# Patient Record
Sex: Male | Born: 2000 | Race: Black or African American | Hispanic: No | Marital: Single | Smoking: Current some day smoker
Health system: Southern US, Community
[De-identification: ages and names within clinical notes are randomized; demographics above are authoritative.]

## PROBLEM LIST (undated history)

## (undated) DIAGNOSIS — J302 Other seasonal allergic rhinitis: Secondary | ICD-10-CM

## (undated) HISTORY — PX: APPENDECTOMY: SHX54

---
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
## 2020-01-26 ENCOUNTER — Emergency Department (HOSPITAL_COMMUNITY)
Admission: EM | Admit: 2020-01-26 | Discharge: 2020-01-27 | Disposition: A | Discharge status: Home / Self Care | Payer: Medicaid Other | Attending: Emergency Medicine | Admitting: Emergency Medicine

## 2020-01-26 ENCOUNTER — Emergency Department (HOSPITAL_COMMUNITY): Payer: Medicaid Other

## 2020-01-26 ENCOUNTER — Encounter (HOSPITAL_COMMUNITY): Payer: Self-pay | Admitting: *Deleted

## 2020-01-26 ENCOUNTER — Other Ambulatory Visit: Payer: Self-pay

## 2020-01-26 DIAGNOSIS — J45909 Unspecified asthma, uncomplicated: Secondary | ICD-10-CM

## 2020-01-26 DIAGNOSIS — S161XXA Strain of muscle, fascia and tendon at neck level, initial encounter: Secondary | ICD-10-CM

## 2020-01-26 DIAGNOSIS — F17200 Nicotine dependence, unspecified, uncomplicated: Secondary | ICD-10-CM | POA: Insufficient documentation

## 2020-01-26 DIAGNOSIS — R0789 Other chest pain: Secondary | ICD-10-CM | POA: Diagnosis not present

## 2020-01-26 DIAGNOSIS — M542 Cervicalgia: Secondary | ICD-10-CM | POA: Insufficient documentation

## 2020-01-26 LAB — I-STAT CREATININE, ED: Creatinine, Ser: 0.9 mg/dL (ref 0.61–1.24)

## 2020-01-26 LAB — D-DIMER, QUANTITATIVE: D-Dimer, Quant: 0.89 ug/mL-FEU — ABNORMAL HIGH (ref 0.00–0.50)

## 2020-01-26 MED ORDER — IBUPROFEN 800 MG PO TABS: 800.0000 mg | ORAL_TABLET | Freq: Three times a day (TID) | ORAL | 0 refills | Status: DC

## 2020-01-26 MED ORDER — METHOCARBAMOL 500 MG PO TABS: 500.0000 mg | ORAL_TABLET | Freq: Two times a day (BID) | ORAL | 0 refills | Status: DC

## 2020-01-26 NOTE — ED Triage Notes (Signed)
The pt is c/o lt soided neck pain for 2 years and sometimes it radiated around into his ribs  No acute distress

## 2020-01-26 NOTE — ED Provider Notes (Signed)
[facility] EMERGENCY DEPARTMENT Provider Note   CSN: [id number] Arrival date & time: 01/26/20  2016     History Chief Complaint  Patient presents with  . Neck Pain    Scott Wiggins is a 19 y.o. male.  HPI 19 year old male with no significant medical history presents to the ER for left-sided neck pain which has been intermittent over the last 2 years.  He states that he feels pain on the left lateral side of his neck whenever he breathes in.  States that this started this morning.  He states he has this happen to him every now and then, over the last 2 years.  He describes it as sharp, "as if someone is poking the me from the inside".  States that sometimes this kind of pain is in his right lower rib cage.  Other times it can be on the left lateral side of his neck.  He denies any chest pain, shortness of breath, fevers, chills, neck pain, back pain, numbness, tingling, weakness down his arms or legs.  He has no difficulty swallowing.  No neck stiffness.[name]  He is unsure why this happens, states that he just has not seen a doctor to follow-up on this.  He denies any heavy lifting, injuries, no inciting incident.  He has not taken anything for symptoms.    History reviewed. No pertinent past medical history.  There are no problems to display for this patient.   History reviewed. No pertinent surgical history.     No family history on file.  Social History   Tobacco Use  . Smoking status: Current Every Day Smoker  . Smokeless tobacco: Never Used  Substance Use Topics  . Alcohol use: Never  . Drug use: Not on file    Home Medications Prior to Admission medications   Medication Sig Start Date End Date Taking? Authorizing Provider  ibuprofen (ADVIL) 800 MG tablet Take 1 tablet (800 mg total) by mouth 3 (three) times daily. 01/26/20   Garald Balding, PA-C  methocarbamol (ROBAXIN) 500 MG tablet Take 1 tablet (500 mg total) by mouth 2 (two) times daily.  01/26/20   Garald Balding, PA-C    Allergies    Patient has no known allergies.  Review of Systems   Review of Systems  Constitutional: Negative for chills and fever.  Respiratory: Negative for chest tightness and shortness of breath.   Cardiovascular: Negative for chest pain.  Gastrointestinal: Negative for abdominal pain.  Musculoskeletal: Positive for neck pain. Negative for back pain and neck stiffness.  Skin: Negative for rash and wound.  Neurological: Negative for tremors, syncope, weakness, numbness and headaches.  Psychiatric/Behavioral: Negative for confusion.    Physical Exam Updated Vital Signs BP 128/68 (BP Location: Left Arm)   Pulse 63   Temp 98.6 F (37 C) (Oral)   Resp 14   Ht 5\' 8"  (1.727 m)   Wt 95.3 kg   SpO2 100%   BMI 31.95 kg/m   Physical Exam Vitals and nursing note reviewed.  Constitutional:      General: He is not in acute distress.    Appearance: Normal appearance. He is well-developed. He is not ill-appearing, toxic-appearing or diaphoretic.  HENT:     Head: Normocephalic and atraumatic.     Nose: Nose normal.     Mouth/Throat:     Mouth: Mucous membranes are moist.  Eyes:     Extraocular Movements: Extraocular movements intact.     Conjunctiva/sclera: Conjunctivae normal.  Pupils: Pupils are equal, round, and reactive to light.  Neck:     Vascular: No carotid bruit.     Comments: No overlying erythema, masses, superior navicular lymphadenopathy.  Full range of motion of neck, 5/5 strength.  No tenderness to palpation over neck bilaterally.  No mastoid tenderness. Cardiovascular:     Rate and Rhythm: Normal rate and regular rhythm.     Pulses: Normal pulses.     Heart sounds: Normal heart sounds. No murmur.  Pulmonary:     Effort: Pulmonary effort is normal. No respiratory distress.     Breath sounds: Normal breath sounds.  Abdominal:     General: Abdomen is flat.     Palpations: Abdomen is soft.     Tenderness: There is no  abdominal tenderness.  Musculoskeletal:        General: No swelling, tenderness, deformity or signs of injury.     Cervical back: Neck supple. No rigidity or tenderness.     Comments: No chest wall tenderness, 5/5 strength in upper and lower extremities.  Sensations intact.  Moving all 4 extremities without difficulty.  Lymphadenopathy:     Cervical: No cervical adenopathy.  Skin:    General: Skin is warm and dry.     Coloration: Skin is not pale.     Findings: No bruising, lesion or rash.  Neurological:     General: No focal deficit present.     Mental Status: He is alert and oriented to person, place, and time.     Sensory: No sensory deficit.     Motor: No weakness.  Psychiatric:        Mood and Affect: Mood normal.        Behavior: Behavior normal.     ED Results / Procedures / Treatments   Labs (all labs ordered are listed, but only abnormal results are displayed) Labs Reviewed  D-DIMER, QUANTITATIVE (NOT AT [facility])    EKG None  Radiology DG Chest Portable 1 View  Result Date: 01/26/2020 CLINICAL DATA:  19 year old male with right chest pain. EXAM: PORTABLE CHEST 1 VIEW COMPARISON:  None. FINDINGS: The lungs are clear. There is no pleural effusion pneumothorax. The cardiac silhouette is within normal limits. No acute osseous pathology. No displaced rib fractures. Soft tissue contusion over the right lateral chest wall. IMPRESSION: No acute cardiopulmonary process. Electronically Signed   By: Elgie Collard M.D.   On: 01/26/2020 22:05    Procedures Procedures (including critical care time)  Medications Ordered in ED Medications - No data to display  ED Course  I have reviewed the triage vital signs and the nursing notes.  Pertinent labs & imaging results that were available during my care of the patient were reviewed by me and considered in my medical decision making (see chart for details).    MDM Rules/Calculators/A&P                     19 year old male with  no medical history with intermittent lateral neck pain and occasional rib pain over the last 2 years. On presentation to the ER, the patient is alert and oriented, nontoxic-appearing, no acute distress, with no increased work of breathing.  Vitals overall reassuring.  Sickle exam without tenderness to palpation, no noticeable masses, normal range of motion, sensations, strength upper extremities.  No evidence of neck stiffness.  He is not tender to palpation to the area where he states he feels pain with every breath.  Rib cage and chest  wall without tenderness, erythema, evidence of injury.  He denies any injuries, falls, heavy lifting.  Given pain with inspiration, will order chest x-ray and D-dimer, though my suspicion for PE/pneumothorax is low.   If this is negative, will DC with muscle relaxers and anti-inflammatories, with close follow-up with PCP.Considered dissection though my suspicion is low given these symptoms have been going on on and off for 2 years.   D-dimer elevated.  After discussion with the patient and his mother, will order CT PE.  Signed out patient to [facility], PA-C.  Pending normal CT scan, anticipate the patient be stable for discharge. D/c w/ muscle relaxer and anti-inflammatories with close PCP followup  Final Clinical Impression(s) / ED Diagnoses Final diagnoses:  Neck pain    Rx / DC Orders ED Discharge Orders         Ordered    ibuprofen (ADVIL) 800 MG tablet  3 times daily     01/26/20 2209    methocarbamol (ROBAXIN) 500 MG tablet  2 times daily     01/26/20 2209           Leone Brand 01/27/20 Amaryllis Dyke, MD 01/27/20 1505

## 2020-01-27 ENCOUNTER — Emergency Department (HOSPITAL_COMMUNITY): Payer: Medicaid Other

## 2020-01-27 MED ORDER — ALBUTEROL SULFATE HFA 108 (90 BASE) MCG/ACT IN AERS
2.0000 | INHALATION_SPRAY | RESPIRATORY_TRACT | 0 refills | Status: DC | PRN
Start: 2020-01-27 — End: 2020-10-03

## 2020-01-27 MED ORDER — IOHEXOL 350 MG/ML SOLN
100.0000 mL | Freq: Once | INTRAVENOUS | Status: AC | PRN
  Administered 2020-01-27: 100 mL via INTRAVENOUS

## 2020-01-27 MED ORDER — IBUPROFEN 600 MG PO TABS: 600.0000 mg | ORAL_TABLET | Freq: Four times a day (QID) | ORAL | 0 refills | Status: DC | PRN

## 2020-01-27 MED ORDER — KETOROLAC TROMETHAMINE 30 MG/ML IJ SOLN
30.0000 mg | Freq: Once | INTRAMUSCULAR | Status: AC
  Administered 2020-01-27: 30 mg via INTRAVENOUS
  Filled 2020-01-27: qty 1

## 2020-01-27 NOTE — Discharge Instructions (Addendum)
Your work-up overall today was reassuring.  Your CT scan did not reveal a specific cause of pain.  However, it did not show a blood clot.  Take 650 mg of Tylenol or 600 mg of ibuprofen with food every 6 hours for pain.  You can alternate between these 2 medications every 3 hours if your pain returns.  For instance, you can take Tylenol at noon, followed by a dose of ibuprofen at 3, followed by second dose of Tylenol and 6.  If the pain in your ribs starts again or if you develop shortness of breath, you can use 2 puffs of the albuterol inhaler once every 4 hours.  Call the number on your discharge paperwork to get established with a primary care provider if your symptoms do not significantly improve with the regimen listed above.  Return to the emergency department if you develop trouble breathing, if your fingers or lips turn blue, if you pass out, develop uncontrollable vomiting, or other new, concerning symptoms.

## 2020-01-27 NOTE — ED Notes (Signed)
CT paged to, states 15-61min until pt can be scanned.

## 2020-01-27 NOTE — ED Provider Notes (Signed)
19 year old male received at signout from [place] pending CT PE study. Per her HPI:   "Scott Wiggins is a 19 y.o. male.  HPI 19 year old male with no significant medical history presents to the ER for left-sided neck pain which has been intermittent over the last 2 years.  He states that he feels pain on the left lateral side of his neck whenever he breathes in.  States that this started this morning.  He states he has this happen to him every now and then, over the last 2 years.  He describes it as sharp, "as if someone is poking the me from the inside".  States that sometimes this kind of pain is in his right lower rib cage.  Other times it can be on the left lateral side of his neck.  He denies any chest pain, shortness of breath, fevers, chills, neck pain, back pain, numbness, tingling, weakness down his arms or legs.  He has no difficulty swallowing.  No neck stiffness.[name]  He is unsure why this happens, states that he just has not seen a doctor to follow-up on this.  He denies any heavy lifting, injuries, no inciting incident.  He has not taken anything for symptoms.  History reviewed. No pertinent past medical history.  There are no problems to display for this patient.   History reviewed. No pertinent surgical history."     Physical Exam  BP 128/68 (BP Location: Left Arm)   Pulse 63   Temp 98.6 F (37 C) (Oral)   Resp 14   Ht 5\' 8"  (1.727 m)   Wt 95.3 kg   SpO2 100%   BMI 31.95 kg/m   Physical Exam Constitutional:      Appearance: He is well-developed.  HENT:     Head: Normocephalic and atraumatic.  Neck:     Comments: Full active and passive range of motion of the neck.  No meningismus. Cardiovascular:     Pulses: Normal pulses.     Heart sounds: Normal heart sounds. No murmur. No friction rub. No gallop.   Pulmonary:     Effort: Pulmonary effort is normal. No respiratory distress.     Breath sounds: No stridor. No wheezing, rhonchi or rales.  Chest:      Chest wall: No tenderness.  Musculoskeletal:     Cervical back: Normal range of motion and neck supple.  Neurological:     Mental Status: He is alert and oriented to person, place, and time.     Cranial Nerves: No cranial nerve deficit.  Psychiatric:        Behavior: Behavior normal.     ED Course/Procedures     Procedures  MDM   19 year old male received a signout from [address] a pending CT PE study.  Please see her note for further work-up and medical decision making.  Study is negative for PE.  There is some concern for mild air trapping.  On my evaluation, the patient does endorse that he smokes Black and mild and occasionally smokes marijuana.  We will give the patient a prescription for albuterol inhaler.  His lungs are clear to auscultation bilaterally and are not diminished on my evaluation.  He has no increased work of breathing.  Regarding his neck pain, suspect musculoskeletal etiology given the duration and intermittent nature.  I have a very, very low suspicion for aortic dissection. Toradol given in the ER.  He was advised to follow-up with primary care if symptoms persist.  He is  hemodynamically stable no acute distress.  Safe for discharge home with outpatient follow-up as indicated.       Joanne Gavel, PA-C 01/27/20 0940    Fatima Blank, MD 01/28/20 [phone]

## 2020-01-27 NOTE — ED Notes (Signed)
Patient verbalizes understanding of discharge instructions. Opportunity for questioning and answers were provided. Armband removed by staff, pt discharged from ED. Pt. ambulatory and discharged home.  

## 2020-02-22 ENCOUNTER — Emergency Department (HOSPITAL_COMMUNITY)
Admission: EM | Admit: 2020-02-22 | Discharge: 2020-02-22 | Disposition: A | Discharge status: Home / Self Care | Payer: Medicaid Other | Attending: Emergency Medicine | Admitting: Emergency Medicine

## 2020-02-22 ENCOUNTER — Encounter (HOSPITAL_COMMUNITY): Payer: Self-pay | Admitting: Emergency Medicine

## 2020-02-22 ENCOUNTER — Other Ambulatory Visit: Payer: Self-pay

## 2020-02-22 DIAGNOSIS — L0501 Pilonidal cyst with abscess: Secondary | ICD-10-CM

## 2020-02-22 DIAGNOSIS — Z79899 Other long term (current) drug therapy: Secondary | ICD-10-CM | POA: Insufficient documentation

## 2020-02-22 DIAGNOSIS — F17200 Nicotine dependence, unspecified, uncomplicated: Secondary | ICD-10-CM | POA: Diagnosis not present

## 2020-02-22 DIAGNOSIS — J45909 Unspecified asthma, uncomplicated: Secondary | ICD-10-CM | POA: Insufficient documentation

## 2020-02-22 MED ORDER — DOXYCYCLINE HYCLATE 100 MG PO CAPS: 100.0000 mg | ORAL_CAPSULE | Freq: Two times a day (BID) | ORAL | 0 refills | Status: AC

## 2020-02-22 MED ORDER — LIDOCAINE-EPINEPHRINE (PF) 2 %-1:200000 IJ SOLN
10.0000 mL | Freq: Once | INTRAMUSCULAR | Status: AC
  Administered 2020-02-22: 10 mL
  Filled 2020-02-22: qty 20

## 2020-02-22 NOTE — ED Triage Notes (Signed)
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

## 2020-02-22 NOTE — Discharge Instructions (Signed)
Take antibiotics as prescribed.  Take the entire course, even if your symptoms improve. Use Tylenol or ibuprofen as needed for pain. Use warm compresses to help with pain and swelling. Follow-up with general surgery for further evaluation and management of your pilonidal cyst. Return to the emergency room if you develop fevers, worsening pain, increased pus draining from the area, or any new, worsening, concerning symptoms.

## 2020-02-22 NOTE — ED Provider Notes (Signed)
[facility] DEPT Provider Note   CSN: [id number] Arrival date & time: 02/22/20  1049     History Chief Complaint  Patient presents with  . Abscess    Scott Wiggins is a 19 y.o. male presenting for evaluation of boil of the buttocks.  Patient states the past 4 days, he has had increasing pain and swelling in the pilonidal region with gluteal cleft.  He reports a history of similar, has had approximately 5 I&D's in the past.  He denies fevers, chills, or pain when doing bowel movement.  He denies pain by his rectum.  Denies nausea, vomiting, abdominal pain.  He has a history of asthma, no other medical problems.  He has not followed up with general surgery regarding this.  He has been taking warm showers and using Tylenol without improvement of pain control.  It has not been draining.  HPI     History reviewed. No pertinent past medical history.  There are no problems to display for this patient.   History reviewed. No pertinent surgical history.     No family history on file.  Social History   Tobacco Use  . Smoking status: Current Every Day Smoker  . Smokeless tobacco: Never Used  Substance Use Topics  . Alcohol use: Never  . Drug use: Yes    Types: Marijuana    Home Medications Prior to Admission medications   Medication Sig Start Date End Date Taking? Authorizing Provider  albuterol (VENTOLIN HFA) 108 (90 Base) MCG/ACT inhaler Inhale 2 puffs into the lungs every 4 (four) hours as needed for wheezing or shortness of breath. 01/27/20   McDonald, Mia A, PA-C  doxycycline (VIBRAMYCIN) 100 MG capsule Take 1 capsule (100 mg total) by mouth 2 (two) times daily for 7 days. 02/22/20 02/29/20  Onedia Vargus, PA-C  ibuprofen (ADVIL) 600 MG tablet Take 1 tablet (600 mg total) by mouth every 6 (six) hours as needed. 01/27/20   McDonald, Mia A, PA-C    Allergies    Patient has no known allergies.  Review of Systems   Review of Systems    Constitutional: Negative for fever.  Skin:       Pilonidal abcess    Physical Exam Updated Vital Signs BP 118/76 (BP Location: Right Arm)   Pulse 98   Temp 98 F (36.7 C) (Oral)   Resp 19   SpO2 99%   Physical Exam Vitals and nursing note reviewed. Exam conducted with a chaperone present.  Constitutional:      General: He is not in acute distress.    Appearance: He is well-developed.  HENT:     Head: Normocephalic and atraumatic.  Pulmonary:     Effort: Pulmonary effort is normal.  Abdominal:     General: There is no distension.     Palpations: There is no mass.     Tenderness: There is no abdominal tenderness. There is no guarding or rebound.  Genitourinary:    Comments: Fluctuance and pain at the apex of the gluteal cleft consistent with pilonidal abscess.  No surrounding skin erythema, warmth, or induration. No pain near the rectum Musculoskeletal:        General: Normal range of motion.     Cervical back: Normal range of motion.  Skin:    General: Skin is warm.     Findings: No rash.  Neurological:     Mental Status: He is alert and oriented to person, place, and time.  ED Results / Procedures / Treatments   Labs (all labs ordered are listed, but only abnormal results are displayed) Labs Reviewed - No data to display  EKG None  Radiology No results found.  Procedures .Marland KitchenIncision and Drainage  Date/Time: 02/22/2020 1:14 PM Performed by: Alveria Apley, PA-C Authorized by: Alveria Apley, PA-C   Consent:    Consent obtained:  Verbal   Consent given by:  Patient   Risks discussed:  Bleeding, damage to other organs, infection, incomplete drainage and pain Location:    Type:  Pilonidal cyst   Location:  Anogenital   Anogenital location:  Pilonidal Pre-procedure details:    Skin preparation:  Antiseptic wash Anesthesia (see MAR for exact dosages):    Anesthesia method:  Local infiltration   Local anesthetic:  Lidocaine 2% WITH  epi Procedure type:    Complexity:  Simple Procedure details:    Needle aspiration: no     Incision types:  Single straight   Incision depth:  Dermal   Scalpel blade:  11   Wound management:  Probed and deloculated   Drainage:  Purulent   Drainage amount:  Copious   Packing materials:  None Post-procedure details:    Patient tolerance of procedure:  Tolerated well, no immediate complications   (including critical care time)  Medications Ordered in ED Medications  lidocaine-EPINEPHrine (XYLOCAINE W/EPI) 2 %-1:200000 (PF) injection 10 mL (10 mLs Infiltration Given 02/22/20 1210)    ED Course  I have reviewed the triage vital signs and the nursing notes.  Pertinent labs & imaging results that were available during my care of the patient were reviewed by me and considered in my medical decision making (see chart for details).    MDM Rules/Calculators/A&P                          Patient presenting with pilonidal abscess.  No signs of systemic infection.  No pain with rectum or pain with bowel movements, do not believe he needs CT.  I&D performed as described above, copious purulent material expressed.  As such, will place on antibiotics.  Encouraged follow-up with general surgery due to recurrent abscesses.  At this time, patient appears safe for discharge.  Return precautions given.  Patient states he understands and agrees to plan.  Final Clinical Impression(s) / ED Diagnoses Final diagnoses:  Pilonidal abscess    Rx / DC Orders ED Discharge Orders         Ordered    doxycycline (VIBRAMYCIN) 100 MG capsule  2 times daily     Discontinue  Reprint     02/22/20 [address] Hydia Copelin, PA-C 02/22/20 1315    Alvira Monday, MD 02/23/20 1512

## 2020-04-04 ENCOUNTER — Ambulatory Visit: Payer: Self-pay | Admitting: Surgery

## 2020-04-04 NOTE — H&P (Signed)
Scott Wiggins Appointment: 04/04/2020 11:00 AM Location: [facility] Patient #: [id number] DOB: 07/23/2001 Single / Language: English / Race: Black or African American Male  History of Present Illness (Philisha Weinel A. 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 He was last seen here in June. His disease is quiet now but he is interested in excision given his multiple bouts of it . He denies pain or drainage today. He has no fever or chills today.  The patient is a 19 year old male.   Allergies (Chanel Nolan, CMA; 04/04/2020 11:00 AM) SALMON Allergies Reconciled  Medication History (Chanel Nolan, CMA; 04/04/2020 11:00 AM) Doxycycline Hyclate (100MG Tablet, Oral) Active. Medications Reconciled    Vitals (Chanel Nolan CMA; 04/04/2020 11:01 AM) 04/04/2020 11:01 AM Weight: 198 lb (93rd percentile) Height: 68in (30th percentile) Body Surface Area: 2.04 m Body Mass Index: 30.11 kg/m  (96th percentile)  Temp.: 96.8F  Pulse: 104 (Regular)   Percentiles calculated using CDC data for children 2-20 years.      Physical Exam (Bruin Bolger A. Carollyn Etcheverry MD; 04/04/2020 11:17 AM)  General Mental Status-Alert. General Appearance-Consistent with stated age. Hydration-Well hydrated. Voice-Normal.  Integumentary Note: 3 cm x 6 cm region of pitting in the gluteal cleft. No active disease. Her follow-up is present. No abscess.  Head and Neck Head-normocephalic, atraumatic with no lesions or palpable masses.  Chest and Lung Exam Chest and lung exam reveals -quiet, even and easy respiratory effort with no use of accessory muscles and on auscultation, normal breath sounds, no adventitious sounds and normal vocal resonance. Inspection Chest Wall - Normal. Back - normal.  Cardiovascular Cardiovascular examination  reveals -on palpation PMI is normal in location and amplitude, no palpable S3 or S4. Normal cardiac borders., normal heart sounds, regular rate and rhythm with no murmurs, carotid auscultation reveals no bruits and normal pedal pulses bilaterally.  Neurologic Neurologic evaluation reveals -alert and oriented x 3 with no impairment of recent or remote memory. Mental Status-Normal.  Musculoskeletal Normal Exam - Left-Upper Extremity Strength Normal and Lower Extremity Strength Normal. Normal Exam - Right-Upper Extremity Strength Normal, Lower Extremity Weakness.    Assessment & Plan (Sherry Blackard A. Aimee Heldman MD; 04/04/2020 11:18 AM)  PILONIDAL ABSCESS (L05.01) Impression: Discussed surgery. The pros and cons of excision were discussed. The risk of wound breakdown is 50% and long-term wound healing issues were discussed since it may take up to a year to heal in some patients. I offered nonoperative management as well since his attacks appear to be not severe but are frequent. Certainly he can be treated with I and D as needed. He is opted for surgical excision. I discussed excision as well as complications of possible advancement flap coverage of the area. Risk of bleeding, infection, poor wound healing, recurrence, injury to neighboring structures, deep vein thrombosis, wound infections in the further treatments and procedures discussed with the patient.  Current Plans Pt Education - [facility] Free Text Education/Instructions: discussed with patient and provided information. The anatomy of the intragluteal cleft was discussed. Pathophysiology of pilonidal disease was discussed. The importance of keeping hairs trimmed to avoid recurrence was discussed. Discussion of options such as curretage, excision with closure vs leaving open was discussed. Risks of infection with need for incision and drainage & antibiotics were discussed. I noted a good likelihood this will help address the problem.  At  this point, I   think the patient would best served with considering surgery to excise the diseased tissue. I will make an attempt to close but it may need to be left open to allow it to heal with secondary intention and wound packing. Possible recurrences need reoperation or different techniques were discussed as well. I noted that recurrence is higher with poor compliance on hair removal hygiene & overall health. The patient's questions were answered. The patient agrees to proceed.  Pt Education - [facility] Pilonidal Surgery HCI - post op instructions: discussed with patient and provided information. The anatomy of the gluteal and sacral region was discussed. Pathophysiology of pilonidal disease due to ingrown hairs was discussed. Importance of good hygiene discussed. Importance of keeping hairs trimmed in the intergluteal crease from anus to top of sacrum/tailbone spine noted. Need for good hygiene stressed. Use of electric razor or nose hair trimmers to keep the hairs trimmed discussed.  Risk of worsening progression needing surgical drainage of abscess or perhaps excision of chronic pilonidal disease with skin flap closure over drains discussed. Possible need for her to leave it open with packing to allow the wound close over several weeks/months discussed. Risks benefits alternatives to surgery discussed as well. Risk of recurrent disease discussed. Patient was expressed understanding. Literature given to patient. We will see if we can control this without an operation first. 

## 2020-05-16 ENCOUNTER — Other Ambulatory Visit: Payer: Self-pay

## 2020-05-16 ENCOUNTER — Encounter (HOSPITAL_BASED_OUTPATIENT_CLINIC_OR_DEPARTMENT_OTHER): Payer: Self-pay | Admitting: Surgery

## 2020-05-17 ENCOUNTER — Other Ambulatory Visit (HOSPITAL_COMMUNITY): Payer: Medicaid Other

## 2020-05-19 ENCOUNTER — Other Ambulatory Visit (HOSPITAL_COMMUNITY)
Admission: RE | Admit: 2020-05-19 | Discharge: 2020-05-19 | Disposition: A | Discharge status: Home / Self Care | Payer: Medicaid Other | Source: Ambulatory Visit | Attending: Surgery | Admitting: Surgery

## 2020-05-19 DIAGNOSIS — Z20822 Contact with and (suspected) exposure to covid-19: Secondary | ICD-10-CM | POA: Diagnosis not present

## 2020-05-19 DIAGNOSIS — Z01812 Encounter for preprocedural laboratory examination: Secondary | ICD-10-CM | POA: Diagnosis present

## 2020-05-20 LAB — SARS CORONAVIRUS 2 (TAT 6-24 HRS): SARS Coronavirus 2: NEGATIVE

## 2020-05-20 NOTE — Progress Notes (Signed)
Attempted to contact patient about picking up CHG pre-surgical solution and ERAS Ensure. No response via phone.

## 2020-05-21 ENCOUNTER — Encounter (HOSPITAL_BASED_OUTPATIENT_CLINIC_OR_DEPARTMENT_OTHER)
Admission: RE | Disposition: A | Discharge status: Home / Self Care | Payer: Self-pay | Source: Home / Self Care | Attending: Surgery

## 2020-05-21 ENCOUNTER — Ambulatory Visit (HOSPITAL_BASED_OUTPATIENT_CLINIC_OR_DEPARTMENT_OTHER): Payer: Medicaid Other | Admitting: Certified Registered"

## 2020-05-21 ENCOUNTER — Encounter (HOSPITAL_BASED_OUTPATIENT_CLINIC_OR_DEPARTMENT_OTHER): Payer: Self-pay | Admitting: Surgery

## 2020-05-21 ENCOUNTER — Other Ambulatory Visit: Payer: Self-pay

## 2020-05-21 ENCOUNTER — Ambulatory Visit (HOSPITAL_BASED_OUTPATIENT_CLINIC_OR_DEPARTMENT_OTHER)
Admission: RE | Admit: 2020-05-21 | Discharge: 2020-05-21 | Disposition: A | Discharge status: Home / Self Care | Payer: Medicaid Other | Attending: Surgery | Admitting: Surgery

## 2020-05-21 DIAGNOSIS — L0501 Pilonidal cyst with abscess: Secondary | ICD-10-CM | POA: Diagnosis not present

## 2020-05-21 DIAGNOSIS — F17200 Nicotine dependence, unspecified, uncomplicated: Secondary | ICD-10-CM | POA: Diagnosis not present

## 2020-05-21 HISTORY — DX: Other seasonal allergic rhinitis: J30.2

## 2020-05-21 HISTORY — PX: PILONIDAL CYST EXCISION: SHX744

## 2020-05-21 SURGERY — EXCISION, SIMPLE PILONIDAL CYST
Anesthesia: General | Site: Buttocks

## 2020-05-21 MED ORDER — CELECOXIB 200 MG PO CAPS
ORAL_CAPSULE | ORAL | Status: AC
  Filled 2020-05-21: qty 1

## 2020-05-21 MED ORDER — GABAPENTIN 300 MG PO CAPS
ORAL_CAPSULE | ORAL | Status: AC
  Filled 2020-05-21: qty 1

## 2020-05-21 MED ORDER — DEXAMETHASONE SODIUM PHOSPHATE 10 MG/ML IJ SOLN
INTRAMUSCULAR | Status: DC | PRN
  Administered 2020-05-21: 5 mg via INTRAVENOUS

## 2020-05-21 MED ORDER — FENTANYL CITRATE (PF) 100 MCG/2ML IJ SOLN
INTRAMUSCULAR | Status: AC
  Filled 2020-05-21: qty 2

## 2020-05-21 MED ORDER — HYDROCODONE-ACETAMINOPHEN 5-325 MG PO TABS: 1.0000 | ORAL_TABLET | Freq: Four times a day (QID) | ORAL | 0 refills | Status: DC | PRN

## 2020-05-21 MED ORDER — MIDAZOLAM HCL 2 MG/2ML IJ SOLN
INTRAMUSCULAR | Status: AC
  Filled 2020-05-21: qty 2

## 2020-05-21 MED ORDER — OXYCODONE HCL 5 MG PO TABS: 5.0000 mg | ORAL_TABLET | Freq: Once | ORAL | Status: DC | PRN

## 2020-05-21 MED ORDER — DOXYCYCLINE HYCLATE 50 MG PO CAPS: 100.0000 mg | ORAL_CAPSULE | Freq: Two times a day (BID) | ORAL | 0 refills | Status: DC

## 2020-05-21 MED ORDER — PROPOFOL 10 MG/ML IV BOLUS
INTRAVENOUS | Status: DC | PRN
  Administered 2020-05-21: 200 mg via INTRAVENOUS

## 2020-05-21 MED ORDER — CHLORHEXIDINE GLUCONATE CLOTH 2 % EX PADS: 6.0000 | MEDICATED_PAD | Freq: Once | CUTANEOUS | Status: DC

## 2020-05-21 MED ORDER — MEPERIDINE HCL 25 MG/ML IJ SOLN: 6.2500 mg | INTRAMUSCULAR | Status: DC | PRN

## 2020-05-21 MED ORDER — CELECOXIB 200 MG PO CAPS
200.0000 mg | ORAL_CAPSULE | ORAL | Status: AC
  Administered 2020-05-21: 200 mg via ORAL

## 2020-05-21 MED ORDER — GABAPENTIN 300 MG PO CAPS
300.0000 mg | ORAL_CAPSULE | ORAL | Status: AC
  Administered 2020-05-21: 300 mg via ORAL

## 2020-05-21 MED ORDER — MIDAZOLAM HCL 5 MG/5ML IJ SOLN
INTRAMUSCULAR | Status: DC | PRN
  Administered 2020-05-21: 2 mg via INTRAVENOUS

## 2020-05-21 MED ORDER — DEXMEDETOMIDINE (PRECEDEX) IN NS 20 MCG/5ML (4 MCG/ML) IV SYRINGE
PREFILLED_SYRINGE | INTRAVENOUS | Status: DC | PRN
  Administered 2020-05-21: 8 ug via INTRAVENOUS

## 2020-05-21 MED ORDER — AMISULPRIDE (ANTIEMETIC) 5 MG/2ML IV SOLN: 10.0000 mg | Freq: Once | INTRAVENOUS | Status: DC | PRN

## 2020-05-21 MED ORDER — CEFAZOLIN SODIUM-DEXTROSE 2-4 GM/100ML-% IV SOLN
2.0000 g | INTRAVENOUS | Status: AC
  Administered 2020-05-21: 2 g via INTRAVENOUS

## 2020-05-21 MED ORDER — LIDOCAINE 2% (20 MG/ML) 5 ML SYRINGE
INTRAMUSCULAR | Status: DC | PRN
  Administered 2020-05-21: 100 mg via INTRAVENOUS

## 2020-05-21 MED ORDER — OXYCODONE HCL 5 MG/5ML PO SOLN: 5.0000 mg | Freq: Once | ORAL | Status: DC | PRN

## 2020-05-21 MED ORDER — IBUPROFEN 800 MG PO TABS: 800.0000 mg | ORAL_TABLET | Freq: Three times a day (TID) | ORAL | 0 refills | Status: DC | PRN

## 2020-05-21 MED ORDER — HYDROMORPHONE HCL 1 MG/ML IJ SOLN: 0.2500 mg | INTRAMUSCULAR | Status: DC | PRN

## 2020-05-21 MED ORDER — ACETAMINOPHEN 500 MG PO TABS
ORAL_TABLET | ORAL | Status: AC
  Filled 2020-05-21: qty 2

## 2020-05-21 MED ORDER — ACETAMINOPHEN 500 MG PO TABS
1000.0000 mg | ORAL_TABLET | ORAL | Status: AC
  Administered 2020-05-21: 1000 mg via ORAL

## 2020-05-21 MED ORDER — ONDANSETRON HCL 4 MG/2ML IJ SOLN
INTRAMUSCULAR | Status: DC | PRN
  Administered 2020-05-21: 4 mg via INTRAVENOUS

## 2020-05-21 MED ORDER — BACITRACIN-NEOMYCIN-POLYMYXIN OINTMENT TUBE
TOPICAL_OINTMENT | CUTANEOUS | Status: DC | PRN
  Administered 2020-05-21: 1 via TOPICAL

## 2020-05-21 MED ORDER — BUPIVACAINE HCL 0.25 % IJ SOLN
INTRAMUSCULAR | Status: DC | PRN
  Administered 2020-05-21: 19 mL

## 2020-05-21 MED ORDER — CEFAZOLIN SODIUM-DEXTROSE 2-4 GM/100ML-% IV SOLN
INTRAVENOUS | Status: AC
  Filled 2020-05-21: qty 100

## 2020-05-21 MED ORDER — FENTANYL CITRATE (PF) 250 MCG/5ML IJ SOLN
INTRAMUSCULAR | Status: DC | PRN
Start: 2020-05-21 — End: 2020-05-21
  Administered 2020-05-21: 100 ug via INTRAVENOUS

## 2020-05-21 MED ORDER — BACITRACIN-NEOMYCIN-POLYMYXIN OINTMENT TUBE
TOPICAL_OINTMENT | CUTANEOUS | Status: AC
  Filled 2020-05-21: qty 14.17

## 2020-05-21 MED ORDER — ROCURONIUM BROMIDE 10 MG/ML (PF) SYRINGE
PREFILLED_SYRINGE | INTRAVENOUS | Status: DC | PRN
  Administered 2020-05-21: 60 mg via INTRAVENOUS

## 2020-05-21 MED ORDER — PROMETHAZINE HCL 25 MG/ML IJ SOLN: 6.2500 mg | INTRAMUSCULAR | Status: DC | PRN

## 2020-05-21 MED ORDER — LACTATED RINGERS IV SOLN: INTRAVENOUS | Status: DC

## 2020-05-21 SURGICAL SUPPLY — 42 items
APL PRP STRL LF DISP 70% ISPRP (MISCELLANEOUS) ×1
APL SKNCLS STERI-STRIP NONHPOA (GAUZE/BANDAGES/DRESSINGS)
BENZOIN TINCTURE PRP APPL 2/3 (GAUZE/BANDAGES/DRESSINGS) IMPLANT
BLADE HEX COATED 2.75 (ELECTRODE) ×3 IMPLANT
BLADE SURG 15 STRL LF DISP TIS (BLADE) ×1 IMPLANT
BLADE SURG 15 STRL SS (BLADE) ×3
BRIEF STRETCH FOR OB PAD XXL (UNDERPADS AND DIAPERS) ×3 IMPLANT
CANISTER SUCT 1200ML W/VALVE (MISCELLANEOUS) ×3 IMPLANT
CHLORAPREP W/TINT 26 (MISCELLANEOUS) ×3 IMPLANT
COVER BACK TABLE 60X90IN (DRAPES) ×3 IMPLANT
COVER MAYO STAND STRL (DRAPES) ×3 IMPLANT
DRAIN PENROSE 1/4X12 LTX STRL (WOUND CARE) ×3 IMPLANT
DRAPE LAPAROTOMY 100X72 PEDS (DRAPES) ×3 IMPLANT
DRAPE UTILITY XL STRL (DRAPES) ×3 IMPLANT
DRSG PAD ABDOMINAL 8X10 ST (GAUZE/BANDAGES/DRESSINGS) ×3 IMPLANT
ELECT REM PT RETURN 9FT ADLT (ELECTROSURGICAL) ×3
ELECTRODE REM PT RTRN 9FT ADLT (ELECTROSURGICAL) ×1 IMPLANT
GAUZE SPONGE 4X4 12PLY STRL LF (GAUZE/BANDAGES/DRESSINGS) ×3 IMPLANT
GLOVE BIOGEL PI IND STRL 8 (GLOVE) ×1 IMPLANT
GLOVE BIOGEL PI INDICATOR 8 (GLOVE) ×2
GLOVE ECLIPSE 8.0 STRL XLNG CF (GLOVE) ×3 IMPLANT
GOWN STRL REUS W/ TWL LRG LVL3 (GOWN DISPOSABLE) ×2 IMPLANT
GOWN STRL REUS W/TWL LRG LVL3 (GOWN DISPOSABLE) ×6
NEEDLE HYPO 25X1 1.5 SAFETY (NEEDLE) ×3 IMPLANT
NS IRRIG 1000ML POUR BTL (IV SOLUTION) ×3 IMPLANT
PACK BASIN DAY SURGERY FS (CUSTOM PROCEDURE TRAY) ×3 IMPLANT
PENCIL SMOKE EVACUATOR (MISCELLANEOUS) ×3 IMPLANT
SPONGE LAP 4X18 RFD (DISPOSABLE) ×3 IMPLANT
STAPLER VISISTAT 35W (STAPLE) IMPLANT
SUT ETHILON 2 0 FS 18 (SUTURE) ×6 IMPLANT
SUT MNCRL AB 3-0 PS2 18 (SUTURE) ×3 IMPLANT
SUT VIC AB 0 CT1 27 (SUTURE) ×3
SUT VIC AB 0 CT1 27XBRD ANBCTR (SUTURE) ×1 IMPLANT
SUT VIC AB 2-0 SH 27 (SUTURE) ×3
SUT VIC AB 2-0 SH 27XBRD (SUTURE) ×1 IMPLANT
SYR CONTROL 10ML LL (SYRINGE) ×3 IMPLANT
TAPE CLOTH 3X10 TAN LF (GAUZE/BANDAGES/DRESSINGS) IMPLANT
TOWEL GREEN STERILE FF (TOWEL DISPOSABLE) ×6 IMPLANT
TUBE CONNECTING 20'X1/4 (TUBING) ×1
TUBE CONNECTING 20X1/4 (TUBING) ×2 IMPLANT
UNDERPAD 30X36 HEAVY ABSORB (UNDERPADS AND DIAPERS) ×3 IMPLANT
YANKAUER SUCT BULB TIP NO VENT (SUCTIONS) ×3 IMPLANT

## 2020-05-21 NOTE — H&P (Signed)
Melchizede I Stebner Appointment: 04/04/2020 11:00 AM Location: [facility] Patient #: [id number] DOB: 08-15-2001 Single / Language: [place] / Race: Black or African American Male  History of Present Illness Scott Fus A. Lanett Lasorsa MD; 04/04/2020 11:16 AM) Patient words: Patient returns for recheck of his bilateral disease. 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 He was last seen here in June. His disease is quiet now but he is interested in excision given his multiple bouts of it . He denies pain or drainage today. He has no fever or chills today.  The patient is a 19 year old male.   Allergies (Chanel Lonni Fix, CMA; 04/04/2020 11:00 AM) SALMON Allergies Reconciled  Medication History (Chanel Lonni Fix, CMA; 04/04/2020 11:00 AM) Doxycycline Hyclate (100MG  Tablet, Oral) Active. Medications Reconciled    Vitals (Chanel Nolan CMA; 04/04/2020 11:01 AM) 04/04/2020 11:01 AM Weight: 198 lb (93rd percentile) Height: 68in (30th percentile) Body Surface Area: 2.04 m Body Mass Index: 30.11 kg/m  (96th percentile)  Temp.: 96.8F  Pulse: 104 (Regular)   Percentiles calculated using CDC data for children 2-20 years.      Physical Exam (Gerrianne Aydelott A. Dima Ferrufino MD; 04/04/2020 11:17 AM)  General Mental Status-Alert. General Appearance-Consistent with stated age. Hydration-Well hydrated. Voice-Normal.  Integumentary Note: 3 cm x 6 cm region of pitting in the gluteal cleft. No active disease. Her follow-up is present. No abscess.  Head and Neck Head-normocephalic, atraumatic with no lesions or palpable masses.  Chest and Lung Exam Chest and lung exam reveals -quiet, even and easy respiratory effort with no use of accessory muscles and on auscultation, normal breath sounds, no adventitious sounds and normal vocal resonance. Inspection Chest Wall - Normal. Back - normal.  Cardiovascular Cardiovascular examination  reveals -on palpation PMI is normal in location and amplitude, no palpable S3 or S4. Normal cardiac borders., normal heart sounds, regular rate and rhythm with no murmurs, carotid auscultation reveals no bruits and normal pedal pulses bilaterally.  Neurologic Neurologic evaluation reveals -alert and oriented x 3 with no impairment of recent or remote memory. Mental Status-Normal.  Musculoskeletal Normal Exam - Left-Upper Extremity Strength Normal and Lower Extremity Strength Normal. Normal Exam - Right-Upper Extremity Strength Normal, Lower Extremity Weakness.    Assessment & Plan (Shemia Bevel A. Ellyse Rotolo MD; 04/04/2020 11:18 AM)  PILONIDAL ABSCESS (L05.01) Impression: Discussed surgery. The pros and cons of excision were discussed. The risk of wound breakdown is 50% and long-term wound healing issues were discussed since it may take up to a year to heal in some patients. I offered nonoperative management as well since his attacks appear to be not severe but are frequent. Certainly he can be treated with I and D as needed. He is opted for surgical excision. I discussed excision as well as complications of possible advancement flap coverage of the area. Risk of bleeding, infection, poor wound healing, recurrence, injury to neighboring structures, deep vein thrombosis, wound infections in the further treatments and procedures discussed with the patient.  Current Plans Pt Education - [facility] Free Text Education/Instructions: discussed with patient and provided information. The anatomy of the intragluteal cleft was discussed. Pathophysiology of pilonidal disease was discussed. The importance of keeping hairs trimmed to avoid recurrence was discussed. Discussion of options such as curretage, excision with closure vs leaving open was discussed. Risks of infection with need for incision and drainage & antibiotics were discussed. I noted a good likelihood this will help address the problem.  At  this point, I  think the patient would best served with considering surgery to excise the diseased tissue. I will make an attempt to close but it may need to be left open to allow it to heal with secondary intention and wound packing. Possible recurrences need reoperation or different techniques were discussed as well. I noted that recurrence is higher with poor compliance on hair removal hygiene & overall health. The patient's questions were answered. The patient agrees to proceed.  Pt Education - [facility] Pilonidal Surgery HCI - post op instructions: discussed with patient and provided information. The anatomy of the gluteal and sacral region was discussed. Pathophysiology of pilonidal disease due to ingrown hairs was discussed. Importance of good hygiene discussed. Importance of keeping hairs trimmed in the intergluteal crease from anus to top of sacrum/tailbone spine noted. Need for good hygiene stressed. Use of electric razor or nose hair trimmers to keep the hairs trimmed discussed.  Risk of worsening progression needing surgical drainage of abscess or perhaps excision of chronic pilonidal disease with skin flap closure over drains discussed. Possible need for her to leave it open with packing to allow the wound close over several weeks/months discussed. Risks benefits alternatives to surgery discussed as well. Risk of recurrent disease discussed. Patient was expressed understanding. Literature given to patient. We will see if we can control this without an operation first.

## 2020-05-21 NOTE — Op Note (Signed)
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  He had no active infection upon examination but had multiple sinus tracts.  The area measured from craniocaudal extent to 5 cm.  It was approximately 2 cm wide and roughly 2 to 3 cm deep on exam.  He is opted for excision.  Discussed complications of poor wound healing and risk of separation of wound requiring prolonged packing and/or marsupialization of  50 % of  Patients.  Nonoperative management as well as preventive strategies discussed with the patient.  Risk, benefits and other options of surgery and/or medical management discussed at great length as well as complex wound healing issues.  Risk of bleeding, infection, nerve injury, blood vessel injury, injury to neighboring structures, wound recurrence, wound breakdown, prolonged extensive packing for healing and the need for reoperation all discussed with the patient.  Description of procedure: The patient was met in the holding and questions were answered.  He was then taken to the operating.  He is placed upon on the OR table where general anesthesia was initiated.  He was then rolled onto his stomach and padded appropriately.  All pressure points were padded.  The gluteal cleft was prepped and draped in sterile fashion.  Timeout performed.  There is approximately 5 center length of sinus tracts noted from complex bilateral disease.  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  There is no active infection that I can see.  This area is prepped and draped in sterile fashion.  Timeout done.  Patient, site and procedure were verified.  Quarter percent Marcaine was injected in the gluteal cleft.  An elliptical incision was made around the sinus tract.  There was an abscess cavity measuring 1 cm deep to this and pus was drained.  This was well debrided and cut back to normal healthy tissue as the granulation tissue was removed.  This is well irrigated.  Local anesthetic was infiltrated.  The skin was mobilized in the subcutaneous plane for 2 cm in all direction.  Skin flaps were then advanced toward each other to obliterate the gluteal cleft.  Quarter inch Penrose drain was placed in the base of the cavity.  A deep layer of 0 Vicryl was placed approximate the deep tissues loosely over the Penrose drain 2-0 nylon was used to close the skin securing the Penrose in place.  All counts were found to be correct.  Dry dressings applied.  All counts were correct.  He was placed in supine extubated taken to recovery in satisfactory condition.

## 2020-05-21 NOTE — Discharge Instructions (Signed)
Pilonidal Cyst Drainage, Care After This sheet gives you information about how to care for yourself after your procedure. Your health care provider may also give you more specific instructions. If you have problems or questions, contact your health care provider. What can I expect after the procedure? After the procedure, it is common to have:  Pain that gets better when you take medicine.  Some fluid or blood coming from your wound. Follow these instructions at home: Medicines  Take over-the-counter and prescription medicines only as told by your health care provider.  If you were prescribed an antibiotic medicine, take it as told by your health care provider. Do not stop taking the antibiotic even if you start to feel better. Lifestyle  Do not do activities that irritate or put pressure on your buttocks for about 2 weeks, or as long as told by your health care provider. These activities include bike riding, running, and anything that involves a twisting motion.  Do not sit for long periods at a time without getting up to move around.  Sleep on your side instead of your back.  Avoid wearing tight underwear and tight pants. Bathing  Do not take baths or showers, swim, or use a hot tub until your health care provider approves. This depends on the type of wound you have from surgery.  While bathing, clean your buttocks area gently with soap and water.  After bathing: ? Pat the area dry with a soft, clean towel. ? Cover the area with a clean bandage (dressing), if told to by your health care provider. General instructions   If you are taking prescription pain medicine, take actions to prevent or treat constipation. Your health care provider may recommend that you: ? Drink enough fluid to keep your urine pale yellow. ? Eat foods that are high in fiber, such as fresh fruits and vegetables, whole grains, and beans. ? Limit foods that are high in fat and processed sugars, such as fried  or sweet foods. ? Take an over-the-counter or prescription medicine for constipation.  You will need to have a caregiver help you manage wound care and dressing changes. Your caregiver should: ? Wash his or her hands with soap and water before changing your dressing. If soap and water are not available, your caregiver should use hand sanitizer. ? Check your wound every day for signs of infection, such as:  Redness, swelling, or more pain.  More fluid or blood.  Warmth.  Pus or a bad smell. ? Follow any additional instructions from your health care provider on how to care for your wound, such as wound cleaning, wound flushing (irrigation), or packing your wound with a dressing.  Keep all follow-up visits as told by your health care provider. This is important. If you had incision and drainage with wound packing:  Return to your health care provider as instructed to have your packing material changed or removed.  Keep the area dry until your packing has been removed.  After the packing has been removed, you may start taking showers. If you had marsupialization:  You may start taking showers the day after surgery, or when your health care provider approves.  Remove your dressing before you shower, but let the water from the shower moisten your dressing before you remove it. This will make it easier to remove.  Ask your health care provider when you can stop using a dressing. If you had incision and drainage without wound packing:  Change your dressing as   directed.  Leave stitches (sutures), skin glue, or adhesive strips in place. These skin closures may need to stay in place for 2 weeks or longer. If adhesive strip edges start to loosen and curl up, you may trim the loose edges. Do not remove adhesive strips completely unless your health care provider tells you to do that. Contact a health care provider if:  You have redness, swelling, or more pain around your wound.  You have  more fluid or blood coming from your wound.  You have new bleeding from your wound.  Your wound feels warm to the touch.  There is pus or a bad smell coming from your wound.  You have pain that does not get better with medicine.  You have a fever or chills.  You have muscle aches.  You are dizzy.  You feel generally sick. Summary  After a procedure to drain a pilonidal cyst, it is common to have some fluid or blood coming from your wound.  If you were prescribed an antibiotic medicine, take it as told by your health care provider. Do not stop taking the antibiotic even if you start to feel better.  Return to your health care provider as instructed to have any packing material changed or removed. This information is not intended to replace advice given to you by your health care provider. Make sure you discuss any questions you have with your health care provider. Document Revised: 06/08/2018 Document Reviewed: 08/08/2017 Elsevier Patient Education  2020 [employer]  

## 2020-05-21 NOTE — Transfer of Care (Signed)
Immediate Anesthesia Transfer of Care Note  Patient: Scott Wiggins  Procedure(s) Performed: EXCISION PILONIDAL CYST (N/A Buttocks)  Patient Location: PACU  Anesthesia Type:General  Level of Consciousness: awake, alert , oriented and patient cooperative  Airway & Oxygen Therapy: Patient Spontanous Breathing and Patient connected to face mask oxygen  Post-op Assessment: Report given to RN, Post -op Vital signs reviewed and stable and Patient moving all extremities  Post vital signs: Reviewed and stable  Last Vitals:  Vitals Value Taken Time  BP 140/84 05/21/20 1400  Temp 36.4 C 05/21/20 1400  Pulse 81 05/21/20 1408  Resp 18 05/21/20 1408  SpO2 100 % 05/21/20 1408  Vitals shown include unvalidated device data.  Last Pain:  Vitals:   05/21/20 1400  TempSrc:   PainSc: 0-No pain         Complications: No complications documented.

## 2020-05-21 NOTE — Anesthesia Postprocedure Evaluation (Signed)
Anesthesia Post Note  Patient: Scott Wiggins  Procedure(s) Performed: EXCISION PILONIDAL CYST (N/A Buttocks)     Patient location during evaluation: PACU Anesthesia Type: General Level of consciousness: awake and alert Pain management: pain level controlled Vital Signs Assessment: post-procedure vital signs reviewed and stable Respiratory status: spontaneous breathing, nonlabored ventilation and respiratory function stable Cardiovascular status: blood pressure returned to baseline and stable Postop Assessment: no apparent nausea or vomiting Anesthetic complications: no   No complications documented.  Last Vitals:  Vitals:   05/21/20 1415 05/21/20 1420  BP: 129/87 116/72  Pulse: 76 76  Resp: 18 18  Temp:  36.7 C  SpO2: 100% 95%    Last Pain:  Vitals:   05/21/20 1420  TempSrc:   PainSc: 0-No pain                 Lowella Curb

## 2020-05-21 NOTE — Interval H&P Note (Signed)
History and Physical Interval Note:  05/21/2020 12:11 PM  Scott Wiggins  has presented today for surgery, with the diagnosis of PILONIDAL ABSCESS.  The various methods of treatment have been discussed with the patient and family. After consideration of risks, benefits and other options for treatment, the patient has consented to  Procedure(s): EXCISION PILONIDAL CYST WITH POSSIBLE ADVANCEMENT FLAP CLOSURE (N/A) as a surgical intervention.  The patient's history has been reviewed, patient examined, no change in status, stable for surgery.  I have reviewed the patient's chart and labs.  Questions were answered to the patient's satisfaction.     Dortha Schwalbe MD

## 2020-05-21 NOTE — Anesthesia Procedure Notes (Signed)
Procedure Name: Intubation Date/Time: 05/21/2020 12:55 PM Performed by: Myna Bright, CRNA Pre-anesthesia Checklist: Patient identified, Emergency Drugs available, Suction available and Patient being monitored Patient Re-evaluated:Patient Re-evaluated prior to induction Oxygen Delivery Method: Circle system utilized Preoxygenation: Pre-oxygenation with 100% oxygen Induction Type: IV induction Ventilation: Mask ventilation without difficulty Laryngoscope Size: Mac and 4 Grade View: Grade II Tube type: Oral Tube size: 7.5 mm Number of attempts: 1 Airway Equipment and Method: Stylet Placement Confirmation: ETT inserted through vocal cords under direct vision,  positive ETCO2 and breath sounds checked- equal and bilateral Secured at: 22 cm Tube secured with: Tape Dental Injury: Teeth and Oropharynx as per pre-operative assessment

## 2020-05-21 NOTE — Anesthesia Preprocedure Evaluation (Signed)
Anesthesia Evaluation  Patient identified by MRN, date of birth, ID band Patient awake    Reviewed: Allergy & Precautions, NPO status , Patient's Chart, lab work & pertinent test results  Airway Mallampati: II  TM Distance: >3 FB Neck ROM: Full    Dental no notable dental hx.    Pulmonary neg pulmonary ROS, Current Smoker and Patient abstained from smoking.,    Pulmonary exam normal breath sounds clear to auscultation       Cardiovascular negative cardio ROS Normal cardiovascular exam Rhythm:Regular Rate:Normal     Neuro/Psych negative neurological ROS  negative psych ROS   GI/Hepatic negative GI ROS, Neg liver ROS,   Endo/Other  negative endocrine ROS  Renal/GU negative Renal ROS  negative genitourinary   Musculoskeletal negative musculoskeletal ROS (+)   Abdominal   Peds negative pediatric ROS (+)  Hematology negative hematology ROS (+)   Anesthesia Other Findings   Reproductive/Obstetrics negative OB ROS                             Anesthesia Physical Anesthesia Plan  ASA: II  Anesthesia Plan: General   Post-op Pain Management:    Induction: Intravenous  PONV Risk Score and Plan: 1 and Ondansetron and Treatment may vary due to age or medical condition  Airway Management Planned: Oral ETT  Additional Equipment:   Intra-op Plan:   Post-operative Plan: Extubation in OR  Informed Consent: I have reviewed the patients History and Physical, chart, labs and discussed the procedure including the risks, benefits and alternatives for the proposed anesthesia with the patient or authorized representative who has indicated his/her understanding and acceptance.     Dental advisory given  Plan Discussed with: CRNA  Anesthesia Plan Comments:         Anesthesia Quick Evaluation

## 2020-05-22 LAB — SURGICAL PATHOLOGY

## 2020-05-23 ENCOUNTER — Encounter (HOSPITAL_BASED_OUTPATIENT_CLINIC_OR_DEPARTMENT_OTHER): Payer: Self-pay | Admitting: Surgery

## 2020-08-11 IMAGING — CT CT ANGIO CHEST
2 of 7 series · 17 of 46 positions shown · IV contrast (APPLIED)
Comparison: None.

CLINICAL DATA: Pleuritic chest pain

EXAM:
CT ANGIOGRAPHY CHEST WITH CONTRAST
TECHNIQUE: Multidetector CT imaging of the chest was performed using the
standard protocol during bolus administration of intravenous
contrast. Multiplanar CT image reconstructions and MIPs were
obtained to evaluate the vascular anatomy.
CONTRAST:  <See Chart> OMNIPAQUE IOHEXOL 350 MG/ML SOLN, 100mL
OMNIPAQUE IOHEXOL 350 MG/ML SOLN

[Series 7: thins · axial · 0.75mm/px · z∈[+1082,+1349]mm · 14 of 430 slices shown]
[im 24/430  lung]
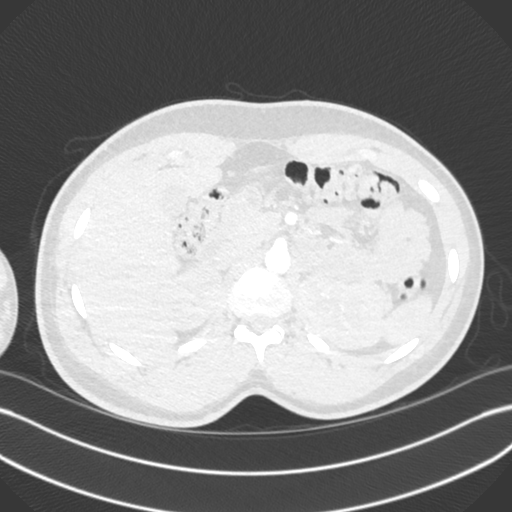
[im 48/430  soft-tissue]
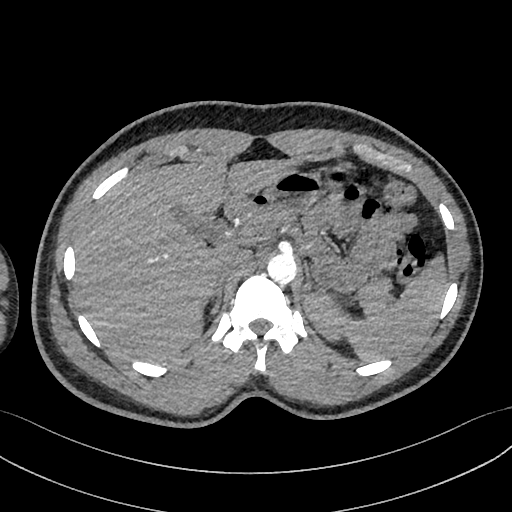
[im 96/430  lung]
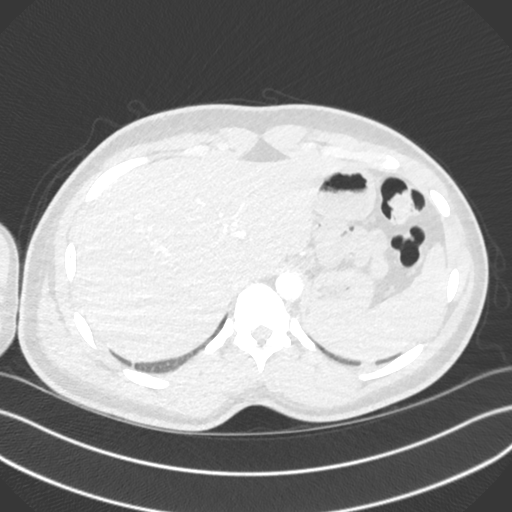
[im 120/430  soft-tissue]
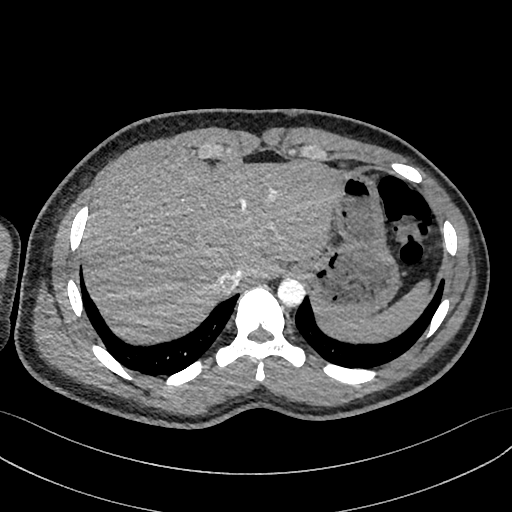
[im 144/430  lung]
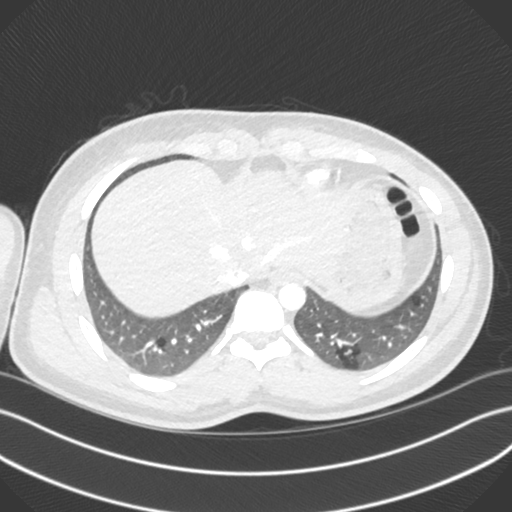
[im 167/430  soft-tissue]
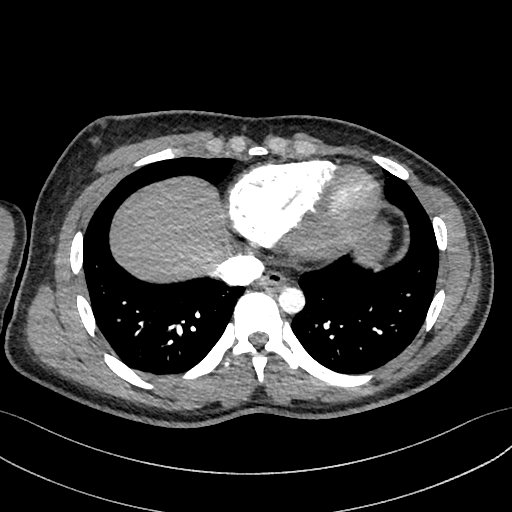
[im 191/430  lung]
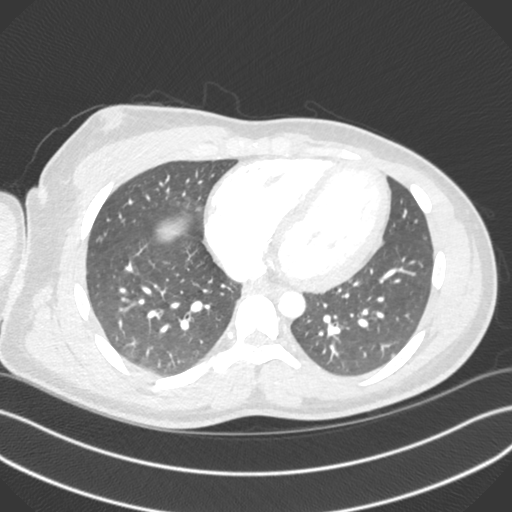
[im 239/430  soft-tissue]
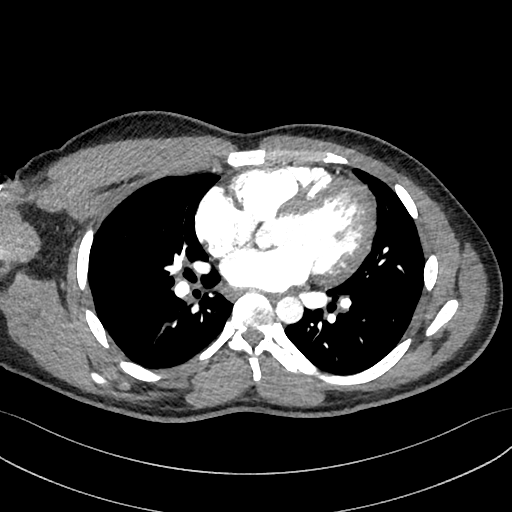
[im 263/430  lung]
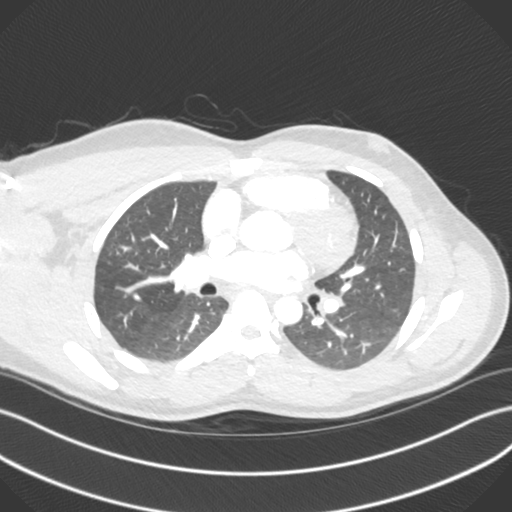
[im 287/430  soft-tissue]
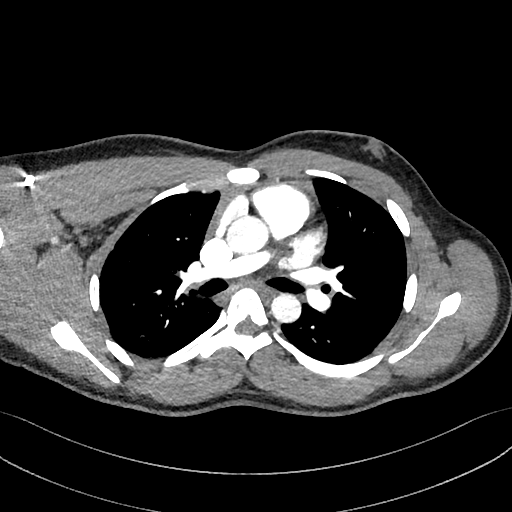
[im 310/430  lung]
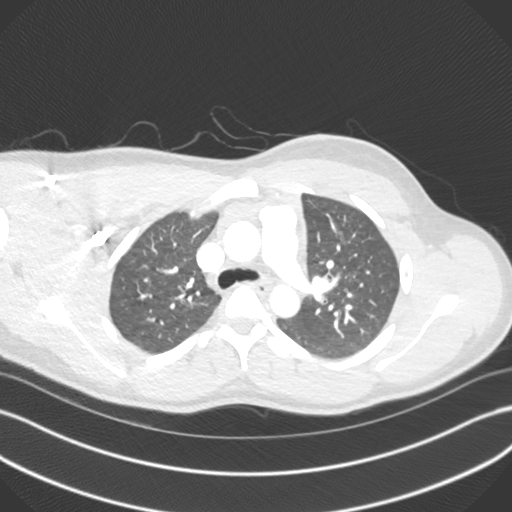
[im 334/430  soft-tissue]
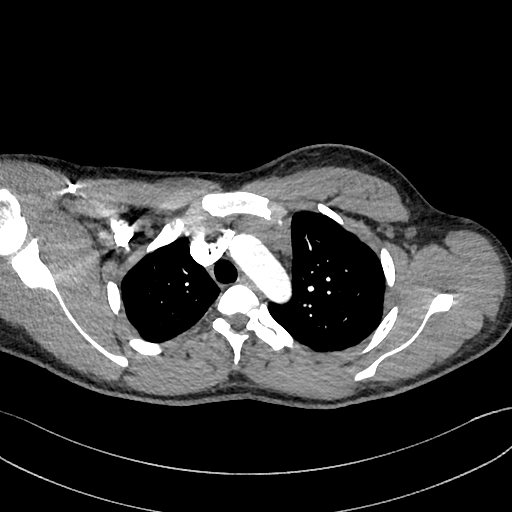
[im 382/430  lung]
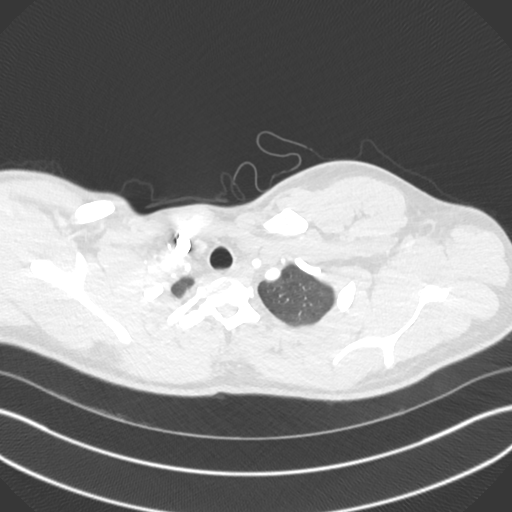
[im 406/430  soft-tissue]
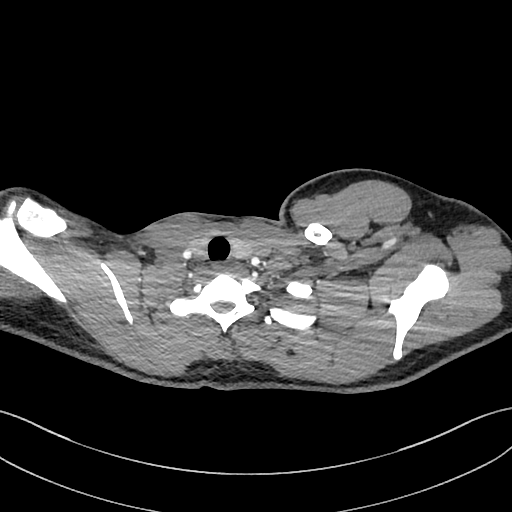

[Series 8: cor · coronal · 0.63mm/px · 3 of 143 slices shown]
[im 36/143  soft-tissue]
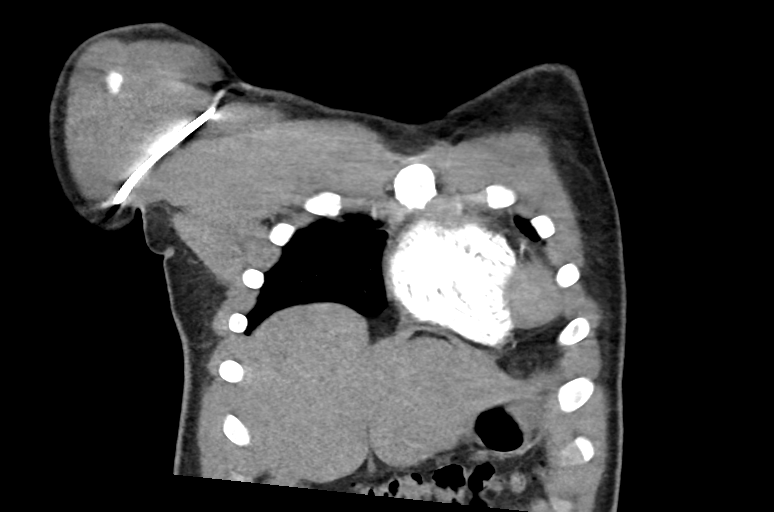
[im 72/143  soft-tissue]
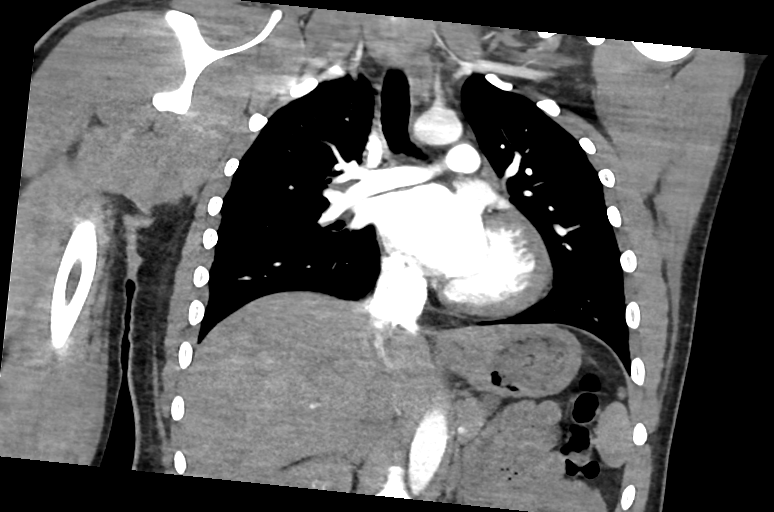
[im 107/143  soft-tissue]
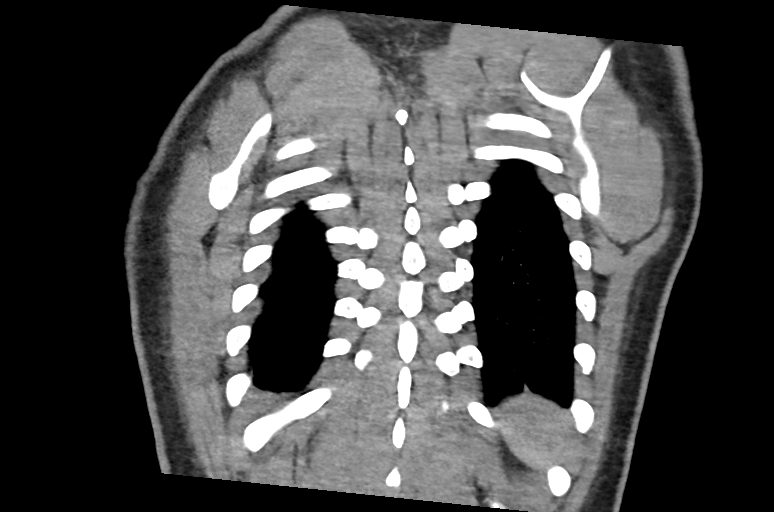

[17 of 46 positions shown; findings below may reference images not displayed]

FINDINGS: Cardiovascular: There is a optimal opacification of the pulmonary
arteries. There is no central,segmental, or subsegmental filling
defects within the pulmonary arteries. The heart is normal in size.
No pericardial effusion or thickening. No evidence right heart
strain. There is normal three-vessel brachiocephalic anatomy without
proximal stenosis. The thoracic aorta is normal in appearance.

Mediastinum/Nodes: No hilar, mediastinal, or axillary adenopathy.
Thyroid gland, trachea, and esophagus demonstrate no significant
findings.

Lungs/Pleura: Focal areas of air trapping are seen at both lung
bases. No large airspace consolidation is seen. No pleural effusion
or pneumothorax. No airspace consolidation.

Upper Abdomen: No acute abnormalities present in the visualized
portions of the upper abdomen.

Musculoskeletal: No chest wall abnormality. No acute or significant
osseous findings.

Review of the MIP images confirms the above findings.
IMPRESSION: No central, segmental, or subsegmental pulmonary embolism.

Small areas of air trapping seen at both lung bases which could be
due to reactive airway disease

## 2020-10-03 ENCOUNTER — Other Ambulatory Visit: Payer: Self-pay

## 2020-10-03 ENCOUNTER — Ambulatory Visit (HOSPITAL_COMMUNITY)
Admission: EM | Admit: 2020-10-03 | Discharge: 2020-10-03 | Disposition: A | Discharge status: Home / Self Care | Payer: Medicaid Other | Attending: Urgent Care | Admitting: Urgent Care

## 2020-10-03 ENCOUNTER — Encounter (HOSPITAL_COMMUNITY): Payer: Self-pay | Admitting: Emergency Medicine

## 2020-10-03 DIAGNOSIS — K0889 Other specified disorders of teeth and supporting structures: Secondary | ICD-10-CM | POA: Diagnosis not present

## 2020-10-03 DIAGNOSIS — K029 Dental caries, unspecified: Secondary | ICD-10-CM

## 2020-10-03 MED ORDER — HYDROCODONE-ACETAMINOPHEN 5-325 MG PO TABS: 1.0000 | ORAL_TABLET | Freq: Four times a day (QID) | ORAL | 0 refills | Status: DC | PRN

## 2020-10-03 MED ORDER — NAPROXEN 500 MG PO TABS: 500.0000 mg | ORAL_TABLET | Freq: Two times a day (BID) | ORAL | 0 refills | Status: DC

## 2020-10-03 NOTE — ED Triage Notes (Signed)
Patient c/o RT lower tooth pain that started 2 months ago.   Patient endorses pain worsened last night.   Patient endorses that he needs to have a tooth extraction and patient has been unable to execute this process.   Patient denies fever at home.   Patient has taken OTC pain medication w/ no relief of symptoms.

## 2020-10-03 NOTE — Discharge Instructions (Signed)
Please schedule naproxen twice daily with food for your severe pain.  If you still have pain despite taking naproxen regularly, this is breakthrough pain.  You can use hydrocodone, a narcotic pain medicine, once every 4-6 hours for this.  Once your pain is better controlled, switch back to just naproxen.  

## 2020-10-03 NOTE — ED Provider Notes (Signed)
[facility]   MRN: [id number] DOB: 2001-08-07  Subjective:   Scott Wiggins is a 19 y.o. male presenting for 2 month history of persistent intermittent bilateral lower tooth pain. Patient is supposed to have dental extraction. Has already set up an appointment and has been examined. They will see him 10/06/2020. Has used otc pain medication, icing. Denies facial swelling, pain, fever, n/v.  No current facility-administered medications for this encounter.  Current Outpatient Medications:  .  albuterol (VENTOLIN HFA) 108 (90 Base) MCG/ACT inhaler, Inhale 2 puffs into the lungs every 4 (four) hours as needed for wheezing or shortness of breath., Disp: 6.7 g, Rfl: 0 .  amoxicillin (AMOXIL) 875 MG tablet, Take 875 mg by mouth 2 (two) times daily., Disp: , Rfl:  .  doxycycline (VIBRAMYCIN) 50 MG capsule, Take 2 capsules (100 mg total) by mouth 2 (two) times daily., Disp: 10 capsule, Rfl: 0 .  HYDROcodone-acetaminophen (NORCO/VICODIN) 5-325 MG tablet, Take 1 tablet by mouth every 6 (six) hours as needed for moderate pain., Disp: 15 tablet, Rfl: 0 .  ibuprofen (ADVIL) 600 MG tablet, Take 1 tablet (600 mg total) by mouth every 6 (six) hours as needed., Disp: 30 tablet, Rfl: 0 .  ibuprofen (ADVIL) 800 MG tablet, Take 1 tablet (800 mg total) by mouth every 8 (eight) hours as needed., Disp: 30 tablet, Rfl: 0   Allergies  Allergen Reactions  . Salmon [Fish Allergy] Rash    Past Medical History:  Diagnosis Date  . Seasonal allergies      Past Surgical History:  Procedure Laterality Date  . PILONIDAL CYST EXCISION N/A 05/21/2020   Procedure: EXCISION PILONIDAL CYST;  Surgeon: Harriette Bouillon, MD;  Location: [facility];  Service: General;  Laterality: N/A;    History reviewed. No pertinent family history.  Social History   Tobacco Use  . Smoking status: Current Every Day Smoker    Packs/day: 0.25    Types: Cigarettes  . Smokeless tobacco: Never  Used  Substance Use Topics  . Alcohol use: Never  . Drug use: Yes    Types: Marijuana    ROS   Objective:   Vitals: BP 140/72 (BP Location: Right Arm)   Pulse 69   Temp 98.6 F (37 C) (Oral)   Resp 17   SpO2 96%   Physical Exam Constitutional:      General: He is not in acute distress.    Appearance: Normal appearance. He is well-developed and normal weight. He is not ill-appearing, toxic-appearing or diaphoretic.  HENT:     Head: Normocephalic and atraumatic.     Right Ear: External ear normal.     Left Ear: External ear normal.     Nose: Nose normal.     Mouth/Throat:     Pharynx: Oropharynx is clear.      Comments: No facial tenderness, swelling, erythema.  Eyes:     General: No scleral icterus.       Right eye: No discharge.        Left eye: No discharge.     Extraocular Movements: Extraocular movements intact.     Pupils: Pupils are equal, round, and reactive to light.  Cardiovascular:     Rate and Rhythm: Normal rate.  Pulmonary:     Effort: Pulmonary effort is normal.  Musculoskeletal:     Cervical back: Normal range of motion.  Neurological:     Mental Status: He is alert and oriented to person, place, and time.  Psychiatric:        Mood and Affect: Mood normal.        Behavior: Behavior normal.        Thought Content: Thought content normal.        Judgment: Judgment normal.     Assessment and Plan :   I have reviewed the PDMP during this encounter.  1. Pain, dental   2. Dental caries     No signs of dental abscess, infection. Schedule naproxen, use hydrocodone for breakthrough pain. Counseled patient on potential for adverse effects with medications prescribed/recommended today, ER and return-to-clinic precautions discussed, patient verbalized understanding.    Wallis Bamberg, PA-C 10/03/20 1517

## 2020-11-30 ENCOUNTER — Ambulatory Visit (HOSPITAL_COMMUNITY)
Admission: EM | Admit: 2020-11-30 | Discharge: 2020-11-30 | Disposition: A | Discharge status: Home / Self Care | Payer: Medicaid Other | Attending: Medical Oncology | Admitting: Medical Oncology

## 2020-11-30 ENCOUNTER — Other Ambulatory Visit: Payer: Self-pay

## 2020-11-30 ENCOUNTER — Encounter (HOSPITAL_COMMUNITY): Payer: Self-pay

## 2020-11-30 DIAGNOSIS — B351 Tinea unguium: Secondary | ICD-10-CM | POA: Diagnosis not present

## 2020-11-30 MED ORDER — CLOTRIMAZOLE 1 % EX CREA: TOPICAL_CREAM | CUTANEOUS | 0 refills | Status: DC

## 2020-11-30 NOTE — ED Provider Notes (Signed)
[facility]    CSN: [id number] Arrival date & time: 11/30/20  1240      History   Chief Complaint Chief Complaint  Patient presents with  . Fingernail problem    HPI Scott Wiggins is a 20 y.o. male.   HPI   Fingernail problem: Patient states that about 5 days ago he noticed that his fingernails were abnormal.  He states that this is affecting the right middle and right pointer finger along with the left pointer finger.  He notes injuries to these areas from getting his nails caught on items.  He states that the nails left up and appear to have some discoloration under them.  They are nonpainful.  He has not tried anything for symptoms.  Past Medical History:  Diagnosis Date  . Seasonal allergies     There are no problems to display for this patient.   Past Surgical History:  Procedure Laterality Date  . PILONIDAL CYST EXCISION N/A 05/21/2020   Procedure: EXCISION PILONIDAL CYST;  Surgeon: Harriette Bouillon, MD;  Location: [facility];  Service: General;  Laterality: N/A;       Home Medications    Prior to Admission medications   Medication Sig Start Date End Date Taking? Authorizing Provider  clotrimazole (LOTRIMIN) 1 % cream Apply to affected area 2 times daily 11/30/20  Yes Shahzain Kiester M, PA-C  HYDROcodone-acetaminophen (NORCO/VICODIN) 5-325 MG tablet Take 1 tablet by mouth every 6 (six) hours as needed for severe pain. 10/03/20   Wallis Bamberg, PA-C  naproxen (NAPROSYN) 500 MG tablet Take 1 tablet (500 mg total) by mouth 2 (two) times daily with a meal. 10/03/20   Wallis Bamberg, PA-C  albuterol (VENTOLIN HFA) 108 (90 Base) MCG/ACT inhaler Inhale 2 puffs into the lungs every 4 (four) hours as needed for wheezing or shortness of breath. 01/27/20 10/03/20  McDonald, Coral Else, PA-C    Family History History reviewed. No pertinent family history.  Social History Social History   Tobacco Use  . Smoking status: Current Every Day Smoker     Packs/day: 0.25    Types: Cigarettes  . Smokeless tobacco: Never Used  Substance Use Topics  . Alcohol use: Never  . Drug use: Yes    Types: Marijuana     Allergies   Salmon [fish allergy]   Review of Systems Review of Systems  As stated above in HPI Physical Exam Triage Vital Signs ED Triage Vitals  Enc Vitals Group     BP 11/30/20 1338 (!) 123/57     Pulse Rate 11/30/20 1338 63     Resp 11/30/20 1338 18     Temp 11/30/20 1338 97.7 F (36.5 C)     Temp Source 11/30/20 1338 Oral     SpO2 11/30/20 1338 100 %     Weight --      Height --      Head Circumference --      Peak Flow --      Pain Score 11/30/20 1339 0     Pain Loc --      Pain Edu? --      Excl. in GC? --    No data found.  Updated Vital Signs BP (!) 123/57 (BP Location: Right Arm)   Pulse 63   Temp 97.7 F (36.5 C) (Oral)   Resp 18   SpO2 100%   Physical Exam Vitals and nursing note reviewed.  Skin:    General: Skin is warm.  Capillary Refill: Capillary refill takes less than 2 seconds.     Comments: There is lifting of the nail from the nailbed on affected fingers with a white to yellow discoloration of the nail.  No abnormal discharge noted, no erythema or other change.  Neurological:     General: No focal deficit present.     Sensory: No sensory deficit.     Motor: No weakness.      UC Treatments / Results  Labs (all labs ordered are listed, but only abnormal results are displayed) Labs Reviewed - No data to display  EKG   Radiology No results found.  Procedures Procedures (including critical care time)  Medications Ordered in UC Medications - No data to display  Initial Impression / Assessment and Plan / UC Course  I have reviewed the triage vital signs and the nursing notes.  Pertinent labs & imaging results that were available during my care of the patient were reviewed by me and considered in my medical decision making (see chart for details).     New.  Suspect  injury complicated by secondary yeast infection.  Treating with Lotrimin.  Discussed with patient how to use this.  We did discuss that the entire nail will need to grow out for the nail to become attached again.  Discussed red flag signs and symptoms. Final Clinical Impressions(s) / UC Diagnoses   Final diagnoses:  Tinea of nail   Discharge Instructions   None    ED Prescriptions    Medication Sig Dispense Auth. Provider   clotrimazole (LOTRIMIN) 1 % cream Apply to affected area 2 times daily 60 g Hatem Cull M, [place]     PDMP not reviewed this encounter.   Rushie Chestnut, [place] 11/30/20 Paulo Fruit

## 2020-11-30 NOTE — ED Triage Notes (Signed)
Pt reports right middle fingernail lifting away from nailbed; states right and left index fingers have same nailbed appearance as well.

## 2020-11-30 NOTE — ED Triage Notes (Signed)
Pt present right middle finger nail has a nail grown under the top nail. Pt noticed this 5 days ago.

## 2020-12-15 ENCOUNTER — Ambulatory Visit (HOSPITAL_COMMUNITY)
Admission: EM | Admit: 2020-12-15 | Discharge: 2020-12-15 | Disposition: A | Discharge status: Home / Self Care | Payer: Medicaid Other | Attending: Internal Medicine | Admitting: Internal Medicine

## 2020-12-15 ENCOUNTER — Encounter (HOSPITAL_COMMUNITY): Payer: Self-pay | Admitting: *Deleted

## 2020-12-15 ENCOUNTER — Other Ambulatory Visit: Payer: Self-pay

## 2020-12-15 ENCOUNTER — Ambulatory Visit (INDEPENDENT_AMBULATORY_CARE_PROVIDER_SITE_OTHER): Payer: Medicaid Other

## 2020-12-15 DIAGNOSIS — M25572 Pain in left ankle and joints of left foot: Secondary | ICD-10-CM | POA: Diagnosis not present

## 2020-12-15 DIAGNOSIS — S92155A Nondisplaced avulsion fracture (chip fracture) of left talus, initial encounter for closed fracture: Secondary | ICD-10-CM | POA: Diagnosis not present

## 2020-12-15 DIAGNOSIS — M25472 Effusion, left ankle: Secondary | ICD-10-CM | POA: Diagnosis not present

## 2020-12-15 DIAGNOSIS — S93402A Sprain of unspecified ligament of left ankle, initial encounter: Secondary | ICD-10-CM | POA: Diagnosis not present

## 2020-12-15 DIAGNOSIS — X501XXA Overexertion from prolonged static or awkward postures, initial encounter: Secondary | ICD-10-CM

## 2020-12-15 NOTE — ED Provider Notes (Signed)
[facility]    CSN: [id number] Arrival date & time: 12/15/20  1324      History   Chief Complaint Chief Complaint  Patient presents with  . Ankle Pain    HPI Jaylun Oommen is a 20 y.o. male.   Kagen Palencia presents with complaints of pain to left ankle. Two days ago was at [facility] on trampolines jumping and twisted/ landed wrong on left ankle. Pain since. Has improved since yesterday. Hasn't taken any medications for pain but did apply a topical medication for pain. No previous ankle injury. No numbness or tingling. Swelling has improved. Ambulatory. ROM intact but causes some discomfort.     ROS per HPI, negative if not otherwise mentioned.      Past Medical History:  Diagnosis Date  . Seasonal allergies     There are no problems to display for this patient.   Past Surgical History:  Procedure Laterality Date  . PILONIDAL CYST EXCISION N/A 05/21/2020   Procedure: EXCISION PILONIDAL CYST;  Surgeon: Harriette Bouillon, MD;  Location: [facility];  Service: General;  Laterality: N/A;       Home Medications    Prior to Admission medications   Medication Sig Start Date End Date Taking? Authorizing Provider  clotrimazole (LOTRIMIN) 1 % cream Apply to affected area 2 times daily 11/30/20   Rushie Chestnut, PA-C  HYDROcodone-acetaminophen (NORCO/VICODIN) 5-325 MG tablet Take 1 tablet by mouth every 6 (six) hours as needed for severe pain. 10/03/20   Wallis Bamberg, PA-C  naproxen (NAPROSYN) 500 MG tablet Take 1 tablet (500 mg total) by mouth 2 (two) times daily with a meal. 10/03/20   Wallis Bamberg, PA-C  albuterol (VENTOLIN HFA) 108 (90 Base) MCG/ACT inhaler Inhale 2 puffs into the lungs every 4 (four) hours as needed for wheezing or shortness of breath. 01/27/20 10/03/20  McDonald, Coral Else, PA-C    Family History History reviewed. No pertinent family history.  Social History Social History   Tobacco Use  . Smoking status: Current  Every Day Smoker    Packs/day: 0.25    Types: Cigarettes  . Smokeless tobacco: Never Used  Substance Use Topics  . Alcohol use: Never  . Drug use: Yes    Types: Marijuana     Allergies   Salmon [fish allergy]   Review of Systems Review of Systems   Physical Exam Triage Vital Signs ED Triage Vitals  Enc Vitals Group     BP 12/15/20 1420 111/61     Pulse Rate 12/15/20 1420 66     Resp 12/15/20 1420 18     Temp 12/15/20 1420 99.4 F (37.4 C)     Temp Source 12/15/20 1420 Oral     SpO2 12/15/20 1420 100 %     Weight --      Height --      Head Circumference --      Peak Flow --      Pain Score 12/15/20 1417 8     Pain Loc --      Pain Edu? --      Excl. in GC? --    No data found.  Updated Vital Signs BP 111/61 (BP Location: Left Arm)   Pulse 66   Temp 99.4 F (37.4 C) (Oral)   Resp 18   SpO2 100%   Visual Acuity Right Eye Distance:   Left Eye Distance:   Bilateral Distance:    Right Eye Near:   Left Eye Near:  Bilateral Near:     Physical Exam Constitutional:      Appearance: He is well-developed.  Cardiovascular:     Rate and Rhythm: Normal rate.  Pulmonary:     Effort: Pulmonary effort is normal.  Musculoskeletal:     Left ankle: No swelling, deformity or ecchymosis. Tenderness present over the ATF ligament. Normal range of motion.     Comments: Left ankle soft tissues with tenderness about the lateral and medial malleolus without bony tenderness; anterior ankle tenderness generalized, worse to anterior lateral ankle; cap refill < 2 seconds; strong pulse; full ROM of ankle   Skin:    General: Skin is warm and dry.  Neurological:     Mental Status: He is alert and oriented to person, place, and time.      UC Treatments / Results  Labs (all labs ordered are listed, but only abnormal results are displayed) Labs Reviewed - No data to display  EKG   Radiology DG Ankle Complete Left  Result Date: 12/15/2020 CLINICAL DATA:  Left ankle  pain and swelling. Recent twisting injury. EXAM: LEFT ANKLE COMPLETE - 3+ VIEW COMPARISON:  None. FINDINGS: There is the suggestion of a tiny avulsion fracture of the anterior dorsal talus with adjacent soft tissue swelling extending into the midfoot. No fracture is identified elsewhere. There is no dislocation. Bone mineralization is subjectively normal. IMPRESSION: Suspected tiny avulsion fracture of the anterior dorsal talus. Electronically Signed   By: Sebastian Ache M.D.   On: 12/15/2020 14:43    Procedures Procedures (including critical care time)  Medications Ordered in UC Medications - No data to display  Initial Impression / Assessment and Plan / UC Course  I have reviewed the triage vital signs and the nursing notes.  Pertinent labs & imaging results that were available during my care of the patient were reviewed by me and considered in my medical decision making (see chart for details).     Left ankle sprain. Suspected tiny avulsion of talus, consistent with sprain. Full ROM of left ankle with mild subjective pain. Minimal swelling and no bruising. ASO provided and pain management discussed. He is on his feet for his work, so recommend follow up with orthopedics for definitive clearance. Patient verbalized understanding and agreeable to plan.  Ambulatory out of clinic without difficulty.   Final Clinical Impressions(s) / UC Diagnoses   Final diagnoses:  Sprain of left ankle, unspecified ligament, initial encounter  Closed nondisplaced avulsion fracture of left talus, initial encounter     Discharge Instructions     There is concern for possible avulsion fracture of your left talus, which is consistent with an ankle sprain.  Ice, elevation, use of ibuprofen to help with pain.  Brace with activity.  Follow up with orthopedics or sports medicine for full activity/ weight bearing clearance   ED Prescriptions    None     PDMP not reviewed this encounter.   Georgetta Haber, NP 12/15/20 1505

## 2020-12-15 NOTE — ED Triage Notes (Signed)
PT reports he twisted his Lt ankle on SAt.

## 2020-12-15 NOTE — Discharge Instructions (Addendum)
There is concern for possible avulsion fracture of your left talus, which is consistent with an ankle sprain.  Ice, elevation, use of ibuprofen to help with pain.  Brace with activity.  Follow up with orthopedics or sports medicine for full activity/ weight bearing clearance

## 2021-06-30 IMAGING — DX DG ANKLE COMPLETE 3+V*L*
3 series · 3 of 3 positions shown · non-contrast
Comparison: None.

CLINICAL DATA: Left ankle pain and swelling. Recent twisting
injury.

EXAM:
LEFT ANKLE COMPLETE - 3+ VIEW

[ankle ap]
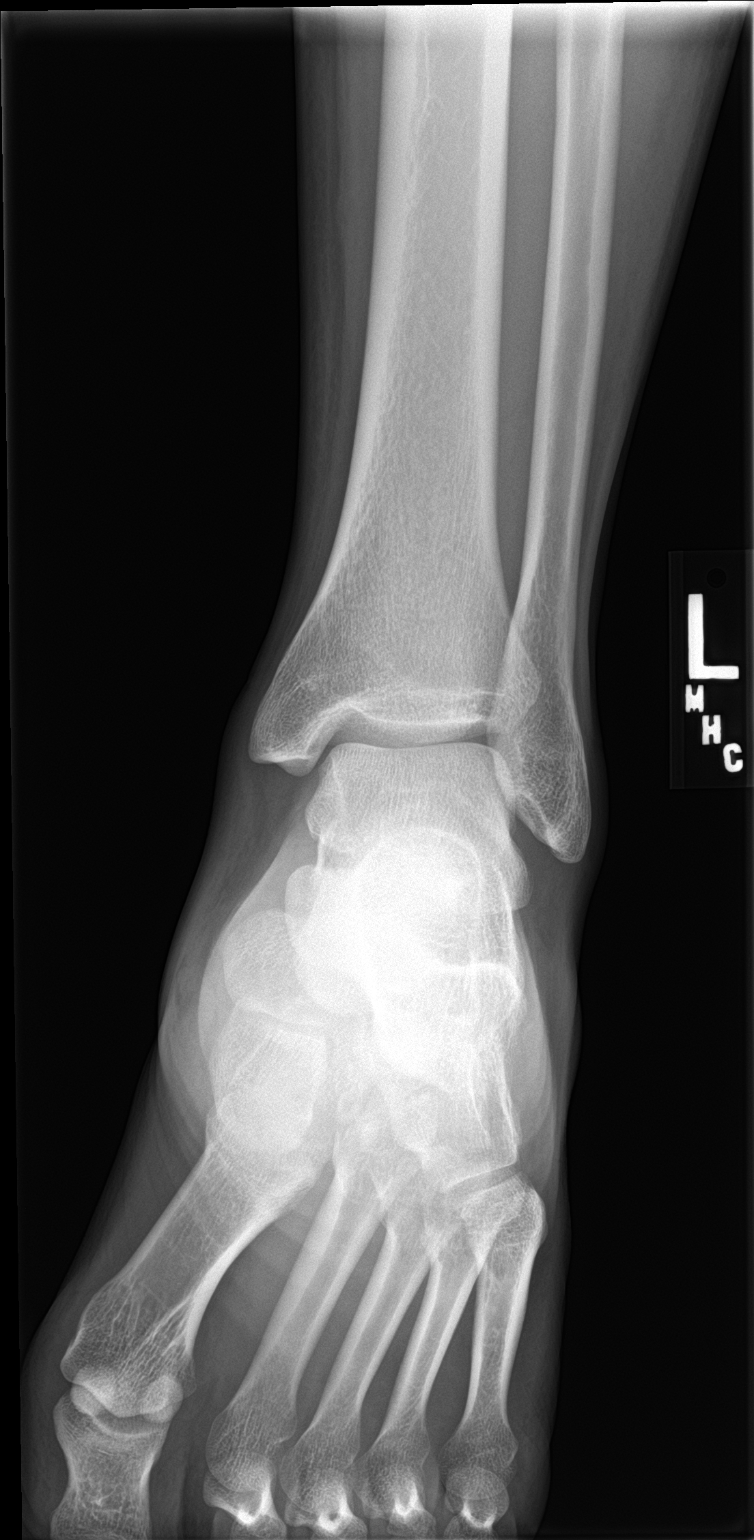

[ankle obl]
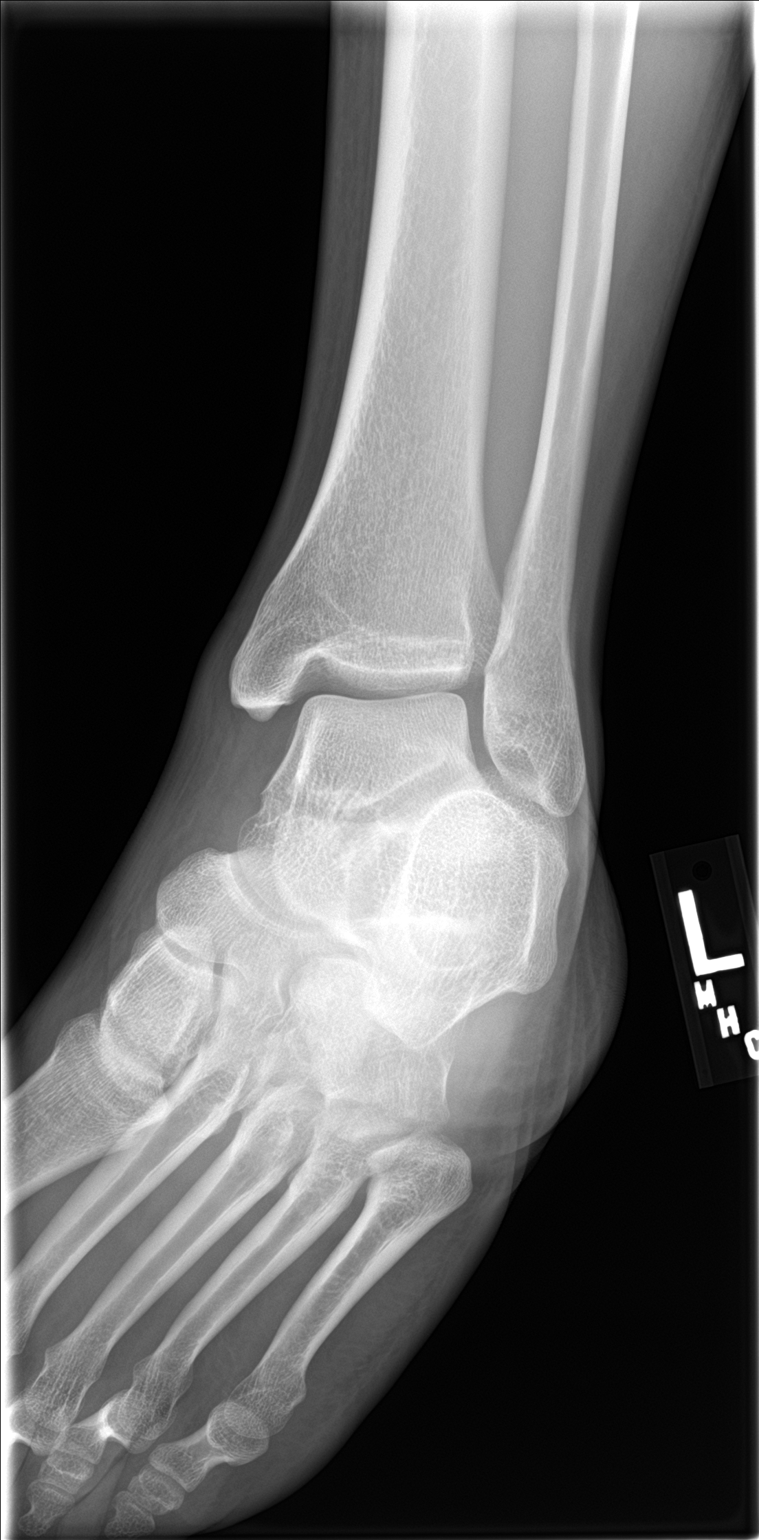

[ankle lat]
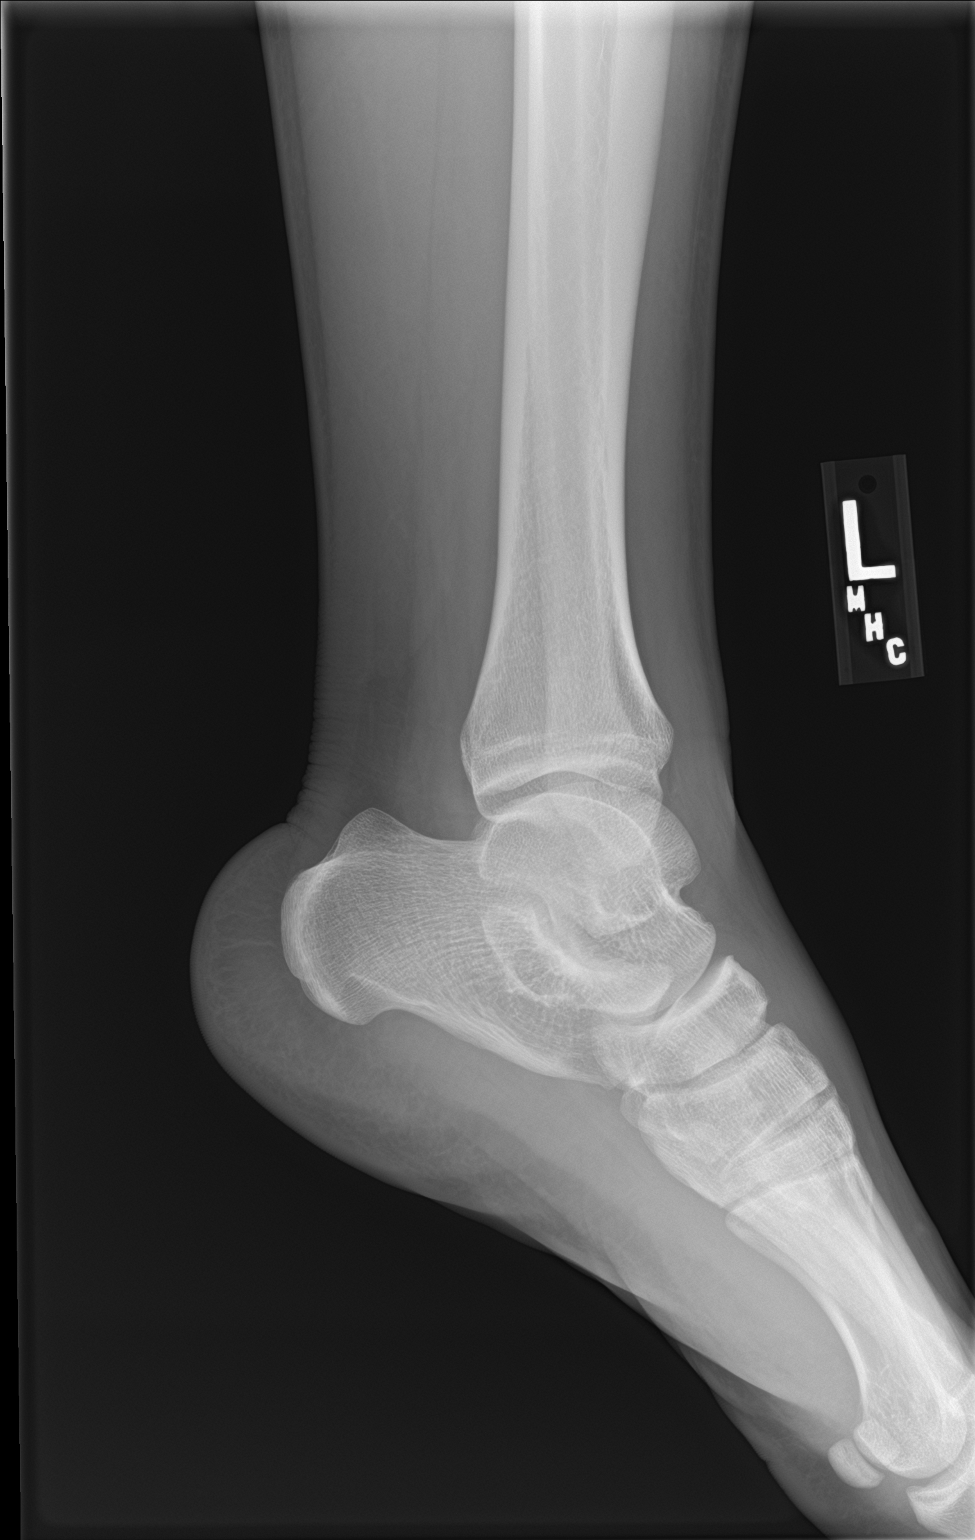

[3 of 3 positions shown; findings below may reference images not displayed]

FINDINGS: There is the suggestion of a tiny avulsion fracture of the anterior
dorsal talus with adjacent soft tissue swelling extending into the
midfoot. No fracture is identified elsewhere. There is no
dislocation. Bone mineralization is subjectively normal.
IMPRESSION: Suspected tiny avulsion fracture of the anterior dorsal talus.

## 2022-10-03 ENCOUNTER — Emergency Department (HOSPITAL_COMMUNITY): Payer: Medicaid Other

## 2022-10-03 ENCOUNTER — Encounter (HOSPITAL_COMMUNITY): Payer: Self-pay

## 2022-10-03 ENCOUNTER — Emergency Department (HOSPITAL_COMMUNITY)
Admission: EM | Admit: 2022-10-03 | Discharge: 2022-10-03 | Disposition: A | Discharge status: Home / Self Care | Payer: Medicaid Other | Attending: Emergency Medicine | Admitting: Emergency Medicine

## 2022-10-03 DIAGNOSIS — R1084 Generalized abdominal pain: Secondary | ICD-10-CM

## 2022-10-03 DIAGNOSIS — F1210 Cannabis abuse, uncomplicated: Secondary | ICD-10-CM | POA: Insufficient documentation

## 2022-10-03 LAB — COMPREHENSIVE METABOLIC PANEL
ALT: 14 U/L (ref 0–44)
AST: 24 U/L (ref 15–41)
Albumin: 4.1 g/dL (ref 3.5–5.0)
Alkaline Phosphatase: 64 U/L (ref 38–126)
Anion gap: 14 (ref 5–15)
BUN: 10 mg/dL (ref 6–20)
CO2: 21 mmol/L — ABNORMAL LOW (ref 22–32)
Calcium: 9 mg/dL (ref 8.9–10.3)
Chloride: 101 mmol/L (ref 98–111)
Creatinine, Ser: 0.95 mg/dL (ref 0.61–1.24)
GFR, Estimated: >60 mL/min (ref >60)
Glucose, Bld: 97 mg/dL (ref 70–99)
Potassium: 3.7 mmol/L (ref 3.5–5.1)
Sodium: 136 mmol/L (ref 135–145)
Total Bilirubin: 0.6 mg/dL (ref 0.3–1.2)
Total Protein: 8.3 g/dL — ABNORMAL HIGH (ref 6.5–8.1)

## 2022-10-03 LAB — CBC WITH DIFFERENTIAL/PLATELET
Abs Immature Granulocytes: 0.03 10*3/uL (ref 0.00–0.07)
Basophils Absolute: 0 10*3/uL (ref 0.0–0.1)
Basophils Relative: 0 %
Eosinophils Absolute: 0 10*3/uL (ref 0.0–0.5)
Eosinophils Relative: 0 %
HCT: 42.6 % (ref 39.0–52.0)
Hemoglobin: 14 g/dL (ref 13.0–17.0)
Immature Granulocytes: 0 %
Lymphocytes Relative: 8 %
Lymphs Abs: 1.2 10*3/uL (ref 0.7–4.0)
MCH: 27.7 pg (ref 26.0–34.0)
MCHC: 32.9 g/dL (ref 30.0–36.0)
MCV: 84.2 fL (ref 80.0–100.0)
Monocytes Absolute: 0.7 10*3/uL (ref 0.1–1.0)
Monocytes Relative: 5 %
Neutro Abs: 12.3 10*3/uL — ABNORMAL HIGH (ref 1.7–7.7)
Neutrophils Relative %: 87 %
Platelets: 263 10*3/uL (ref 150–400)
RBC: 5.06 MIL/uL (ref 4.22–5.81)
RDW: 13.9 % (ref 11.5–15.5)
WBC: 14.3 10*3/uL — ABNORMAL HIGH (ref 4.0–10.5)
nRBC: 0 % (ref 0.0–0.2)

## 2022-10-03 LAB — RAPID URINE DRUG SCREEN, HOSP PERFORMED
Amphetamines: NOT DETECTED
Barbiturates: NOT DETECTED
Benzodiazepines: NOT DETECTED
Cocaine: NOT DETECTED
Opiates: NOT DETECTED
Tetrahydrocannabinol: POSITIVE — AB

## 2022-10-03 LAB — LIPASE, BLOOD: Lipase: 33 U/L (ref 11–51)

## 2022-10-03 MED ORDER — SODIUM CHLORIDE 0.9 % IV BOLUS
1000.0000 mL | Freq: Once | INTRAVENOUS | Status: AC
  Administered 2022-10-03: 1000 mL via INTRAVENOUS

## 2022-10-03 MED ORDER — IOHEXOL 300 MG/ML  SOLN
100.0000 mL | Freq: Once | INTRAMUSCULAR | Status: AC | PRN
  Administered 2022-10-03: 100 mL via INTRAVENOUS

## 2022-10-03 MED ORDER — HYDROMORPHONE HCL 1 MG/ML IJ SOLN
1.0000 mg | Freq: Once | INTRAMUSCULAR | Status: AC
  Administered 2022-10-03: 1 mg via INTRAVENOUS
  Filled 2022-10-03: qty 1

## 2022-10-03 MED ORDER — ONDANSETRON HCL 4 MG/2ML IJ SOLN
4.0000 mg | Freq: Once | INTRAMUSCULAR | Status: AC
  Administered 2022-10-03: 4 mg via INTRAVENOUS
  Filled 2022-10-03: qty 2

## 2022-10-03 MED ORDER — ONDANSETRON HCL 4 MG PO TABS: 4.0000 mg | ORAL_TABLET | Freq: Four times a day (QID) | ORAL | 0 refills | Status: DC

## 2022-10-03 NOTE — ED Triage Notes (Signed)
Abdominal pain that start two hours ago. He states that he had emesis x1 states that it has red specks in it. Intermitting discomfort.

## 2022-10-03 NOTE — Discharge Instructions (Signed)
I recommend a bland diet, I given you Zofran please take as prescribed.  I like to come back to the emergency department if you have worsening abdominal pain especially if it is in the right lower aspect, uncontrolled nausea vomiting despite the medication given or that you feel like your symptoms are getting worse.

## 2022-10-03 NOTE — ED Provider Notes (Signed)
[place] EMERGENCY DEPARTMENT AT [facility] Provider Note   CSN: [id number] Arrival date & time: 10/03/22  0245     History  Chief Complaint  Patient presents with   Abdominal Pain    Scott Wiggins is a 22 y.o. male.  HPI   Patient without significant medical history presented with complaints of abdominal pain.  Patient states that started about 2 hours ago, states it came on after he ate [employer], states pain is all over, does not radiate, associated nausea and vomiting denies bloody emesis or coffee-ground emesis, still passing gas having normal bowel movements last bowel was yesterday.  He has no abdominal history, denies excessive NSAID use or alcohol use, he does admit to marijuana use, has never had cyclic vomiting before.  He has no other complaints.    Home Medications Prior to Admission medications   Medication Sig Start Date End Date Taking? Authorizing Provider  Doxylamine Succinate, Sleep, (SLEEP AID PO) Take 1 tablet by mouth once.   Yes [provider]  Fructose-Dextrose-Phosphor Acd (NAUSEA CONTROL PO) Take 1 tablet by mouth once.   Yes [provider]  ondansetron (ZOFRAN) 4 MG tablet Take 1 tablet (4 mg total) by mouth every 6 (six) hours. 10/03/22  Yes Marcello Fennel, PA-C  clotrimazole (LOTRIMIN) 1 % cream Apply to affected area 2 times daily Patient not taking: Reported on 10/03/2022 11/30/20   Hughie Closs, PA-C  HYDROcodone-acetaminophen (NORCO/VICODIN) 5-325 MG tablet Take 1 tablet by mouth every 6 (six) hours as needed for severe pain. Patient not taking: Reported on 10/03/2022 10/03/20   Jaynee Eagles, PA-C  naproxen (NAPROSYN) 500 MG tablet Take 1 tablet (500 mg total) by mouth 2 (two) times daily with a meal. Patient not taking: Reported on 10/03/2022 10/03/20   Jaynee Eagles, PA-C  albuterol (VENTOLIN HFA) 108 (90 Base) MCG/ACT inhaler Inhale 2 puffs into the lungs every 4 (four) hours as needed for wheezing or shortness  of breath. 01/27/20 10/03/20  McDonald, Mia A, PA-C      Allergies    Hyman Hopes allergy]    Review of Systems   Review of Systems  Constitutional:  Negative for chills and fever.  Respiratory:  Negative for shortness of breath.   Cardiovascular:  Negative for chest pain.  Gastrointestinal:  Positive for abdominal pain and vomiting. Negative for diarrhea.  Neurological:  Negative for headaches.    Physical Exam Updated Vital Signs BP (!) 109/98   Pulse 73   Temp 98.7 F (37.1 C) (Oral)   Resp 18   Ht 5\' 9"  (1.753 m)   Wt 74.8 kg   SpO2 100%   BMI 24.37 kg/m  Physical Exam Vitals and nursing note reviewed.  Constitutional:      General: He is not in acute distress.    Appearance: He is not ill-appearing.  HENT:     Head: Normocephalic and atraumatic.     Nose: No congestion.  Eyes:     Conjunctiva/sclera: Conjunctivae normal.  Cardiovascular:     Rate and Rhythm: Normal rate and regular rhythm.     Pulses: Normal pulses.     Heart sounds: No murmur heard.    No friction rub. No gallop.  Pulmonary:     Effort: No respiratory distress.     Breath sounds: No wheezing, rhonchi or rales.  Abdominal:     Palpations: Abdomen is soft.     Tenderness: There is abdominal tenderness. There is no right CVA tenderness  or left CVA tenderness.     Comments: Abdomen nondistended, soft, tenderness noted mainly in the epigastric region without guarding or rebound tenderness or peritoneal sign.  Skin:    General: Skin is warm and dry.  Neurological:     Mental Status: He is alert.  Psychiatric:        Mood and Affect: Mood normal.     ED Results / Procedures / Treatments   Labs (all labs ordered are listed, but only abnormal results are displayed) Labs Reviewed  COMPREHENSIVE METABOLIC PANEL - Abnormal; Notable for the following components:      Result Value   CO2 21 (*)    Total Protein 8.3 (*)    All other components within normal limits  CBC WITH DIFFERENTIAL/PLATELET  - Abnormal; Notable for the following components:   WBC 14.3 (*)    Neutro Abs 12.3 (*)    All other components within normal limits  RAPID URINE DRUG SCREEN, HOSP PERFORMED - Abnormal; Notable for the following components:   Tetrahydrocannabinol POSITIVE (*)    All other components within normal limits  LIPASE, BLOOD    EKG None  Radiology CT ABDOMEN PELVIS W CONTRAST  Result Date: 10/03/2022 CLINICAL DATA:  Abdominal pain.  One episode of emesis. EXAM: CT ABDOMEN AND PELVIS WITH CONTRAST TECHNIQUE: Multidetector CT imaging of the abdomen and pelvis was performed using the standard protocol following bolus administration of intravenous contrast. RADIATION DOSE REDUCTION: This exam was performed according to the departmental dose-optimization program which includes automated exposure control, adjustment of the mA and/or kV according to patient size and/or use of iterative reconstruction technique. CONTRAST:  OMNIPAQUE IOHEXOL 300 MG/ML  SOLN COMPARISON:  CT angio chest 01/27/2020 FINDINGS: Lower chest: No pleural fluid or airspace disease. Bronchial wall thickening identified which may reflect chronic smoking related change. Unchanged subpleural nodule within the right lower lobe measuring 4 mm, image 26/7. Subpleural nodule in the posterior left lower lobe is unchanged measuring 4 mm, image 30/7. Stable perifissural nodule along the oblique fissure measuring 5 mm, image 10/7. Hepatobiliary: No focal liver abnormality is seen. No gallstones, gallbladder wall thickening, or biliary dilatation. Pancreas: Unremarkable. No pancreatic ductal dilatation or surrounding inflammatory changes. Spleen: Normal in size without focal abnormality. Adrenals/Urinary Tract: Adrenal glands are unremarkable. Kidneys are normal, without renal calculi, focal lesion, or hydronephrosis. Bladder is unremarkable. Stomach/Bowel: Stomach appears normal. The appendix is identified extending along the right pericolic gutter  and appears thickened measuring 1 cm, image 55/2. No surrounding free fluid or fluid collections. No bowel wall thickening, inflammation, or distension. Vascular/Lymphatic: No abdominal adenopathy. External iliac lymph node measures 1.2 cm, image 70/2. Enlarged bilateral inguinal lymph nodes are noted including: Right inguinal lymph node measuring 1.3 cm, image 78/2. Left inguinal lymph node measures 1.3 cm, image 87/2. Reproductive: Prostate is unremarkable. Other: No free fluid or fluid collections. No signs of pneumoperitoneum. Musculoskeletal: There is diffuse irregular skin thickening throughout the body wall with multiple small subcutaneous fluid collections noted. Example within the right lower quadrant ventral abdominal wall there is a small subcutaneous fluid collection measuring 1.3 x 0.8 cm with surrounding soft tissue thickening. Along the midline of the ventral abdominal wall there is a large thin broad-based intradermal fluid collection which measures 5 mm in thickness, image 31/2. No acute or suspicious osseous findings. IMPRESSION: 1. The appendix is identified extending along the right pericolic gutter and appears thickened measuring 1 cm. No surrounding free fluid or fluid collections. Correlate for any  clinical signs or symptoms of acute appendicitis. 2. There is diffuse irregular skin thickening throughout the body wall, this is most pronounced over the gluteal regions. Multiple small intradermal fluid collections are identified. I suspect this is related to a chronic skin condition of uncertain etiology. Careful clinical correlation and correlation with patient's clinical history is advised. 3. Enlarged bilateral inguinal and external iliac lymph nodes are likely reactive. 4. Small pulmonary nodules are unchanged from 01/27/2020 compatible with a benign process. 5. Bronchial wall thickening which may reflect chronic smoking related change. Electronically Signed   By: Kerby Moors M.D.   On:  10/03/2022 06:46    Procedures Procedures    Medications Ordered in ED Medications  sodium chloride 0.9 % bolus 1,000 mL (0 mLs Intravenous Stopped 10/03/22 0559)  ondansetron (ZOFRAN) injection 4 mg (4 mg Intravenous Given 10/03/22 0335)  HYDROmorphone (DILAUDID) injection 1 mg (1 mg Intravenous Given 10/03/22 0335)  iohexol (OMNIPAQUE) 300 MG/ML solution 100 mL (100 mLs Intravenous Contrast Given 10/03/22 0604)    ED Course/ Medical Decision Making/ A&P                             Medical Decision Making Amount and/or Complexity of Data Reviewed Labs: ordered. Radiology: ordered.  Risk Prescription drug management.   This patient presents to the ED for concern of abdominal pain, this involves an extensive number of treatment options, and is a complaint that carries with it a high risk of complications and morbidity.  The differential diagnosis includes volvulus, diverticulitis, bowel obstruction, gastroenteritis    Additional history obtained:  Additional history obtained from N/A External records from outside source obtained and reviewed including recent ER notes   Co morbidities that complicate the patient evaluation  N/A  Social Determinants of Health:  No PCP    Lab Tests:  I Ordered, and personally interpreted labs.  The pertinent results include: CBC shows leukocytosis of 14.3, CMP shows CO2 of 21, lipase 33, rapid urine drug seen positive for cannabis   Imaging Studies ordered:  I ordered imaging studies including CT AP I independently visualized and interpreted imaging which showed appendix is slightly thickened, no surrounding fluid, or significant inflammation, there is also diffuse irregular skin thickening throughout the body.  I agree with the radiologist interpretation   Cardiac Monitoring:  The patient was maintained on a cardiac monitor.  I personally viewed and interpreted the cardiac monitored which showed an underlying rhythm of:  n/a   Medicines ordered and prescription drug management:  I ordered medication including fluids, pain medication, antiemetics I have reviewed the patients home medicines and have made adjustments as needed  Critical Interventions:  N/a   Reevaluation:  Presents with abdominal pain nausea vomiting, will obtain basic lab workup, provide with fluids pain medication antiemetics and reassess.  Patient was reassessed after fluids, antiemetics, states he is feeling better, abdomen is soft nontender during my evaluation, will p.o. challenge and reassess.  Reassessed the patient, patient has recurrent abdominal tenderness, notes diffuse, will send down for CT abdomen pelvis to rule out of possible intra-abdominal infection.  I reassessed the patient, states pain has resolved, I palpated his abdomen he has no right lower quadrant tenderness negative McBurney point or obturator, he is starting p.o., he is agreement discharge at this time.     Consultations Obtained:  N/A    Test Considered:  Hospital admission-shared decision making this will be deferred as he has  a nonsurgical abdomen, my suspicion for appendicitis is low, explain the slightly enlarged appendix and this could be potentially appendicitis, patient would like to go home and coming back if symptoms worsening.    Rule out low suspicion for lower lobe pneumonia as lung sounds are clear bilaterally, will defer imaging at this time.  I have low suspicion for liver or gallbladder abnormality as liver enzymes, alk phos, T bili all within normal limits.  Low suspicion for pancreatitis as lipase is within normal limits.  Doubt UTI [place]o kidney stone denies any urinary symptoms, no flank tenderness no CVA tenderness.  Suspicion for appendicitis is low at this time, as she has no abdominal tenderness during my reassessment, he does have a slightly elevated leukocytosis by suspect this more acute phase reaction from nausea and  vomiting.  Some diffuse skin irregularities, patient has known hidradenitis, he denies any rectal pain rectal discharge, I doubt this is an acute process.  My suspicion for bowel obstruction, volvulus, diverticulitis, is low at this time CT imaging is negative.   Dispostion and problem list  After consideration of the diagnostic results and the patients response to treatment, I feel that the patent would benefit from discharge.  Abdominal pain-suspect this was gastritis from food intake as well as cannabis use, will recommend a bland diet, will provide him with antiemetics and should return precautions            Final Clinical Impression(s) / ED Diagnoses Final diagnoses:  Generalized abdominal pain    Rx / DC Orders ED Discharge Orders          Ordered    ondansetron (ZOFRAN) 4 MG tablet  Every 6 hours        10/03/22 0707              Marcello Fennel, PA-C 51/76/16 0737    Delora Fuel, MD 10/62/69 [phone]

## 2022-10-03 NOTE — ED Notes (Signed)
Patient given water. Pt is currently drinking small sips

## 2022-11-03 ENCOUNTER — Other Ambulatory Visit: Payer: Self-pay

## 2022-11-03 ENCOUNTER — Emergency Department (HOSPITAL_COMMUNITY): Payer: Medicaid Other | Admitting: Certified Registered"

## 2022-11-03 ENCOUNTER — Emergency Department (HOSPITAL_COMMUNITY): Payer: Medicaid Other

## 2022-11-03 ENCOUNTER — Encounter (HOSPITAL_COMMUNITY)
Admission: EM | Disposition: A | Discharge status: Home / Self Care | Payer: Self-pay | Source: Home / Self Care | Attending: Emergency Medicine

## 2022-11-03 ENCOUNTER — Ambulatory Visit (HOSPITAL_COMMUNITY)
Admission: EM | Admit: 2022-11-03 | Discharge: 2022-11-03 | Disposition: A | Discharge status: Home / Self Care | Payer: Medicaid Other | Attending: Emergency Medicine | Admitting: Emergency Medicine

## 2022-11-03 ENCOUNTER — Emergency Department (HOSPITAL_BASED_OUTPATIENT_CLINIC_OR_DEPARTMENT_OTHER): Payer: Medicaid Other | Admitting: Certified Registered"

## 2022-11-03 ENCOUNTER — Encounter (HOSPITAL_COMMUNITY): Payer: Self-pay | Admitting: Radiology

## 2022-11-03 DIAGNOSIS — R112 Nausea with vomiting, unspecified: Secondary | ICD-10-CM | POA: Diagnosis not present

## 2022-11-03 DIAGNOSIS — K5900 Constipation, unspecified: Secondary | ICD-10-CM | POA: Insufficient documentation

## 2022-11-03 DIAGNOSIS — K3580 Unspecified acute appendicitis: Secondary | ICD-10-CM

## 2022-11-03 DIAGNOSIS — K353 Acute appendicitis with localized peritonitis, without perforation or gangrene: Secondary | ICD-10-CM | POA: Insufficient documentation

## 2022-11-03 DIAGNOSIS — F17210 Nicotine dependence, cigarettes, uncomplicated: Secondary | ICD-10-CM | POA: Diagnosis not present

## 2022-11-03 HISTORY — PX: LAPAROSCOPIC APPENDECTOMY: SHX408

## 2022-11-03 LAB — CBC WITH DIFFERENTIAL/PLATELET
Abs Immature Granulocytes: 0.11 10*3/uL — ABNORMAL HIGH (ref 0.00–0.07)
Basophils Absolute: 0.1 10*3/uL (ref 0.0–0.1)
Basophils Relative: 0 %
Eosinophils Absolute: 0 10*3/uL (ref 0.0–0.5)
Eosinophils Relative: 0 %
HCT: 46.2 % (ref 39.0–52.0)
Hemoglobin: 14.9 g/dL (ref 13.0–17.0)
Immature Granulocytes: 1 %
Lymphocytes Relative: 8 %
Lymphs Abs: 1.6 10*3/uL (ref 0.7–4.0)
MCH: 27 pg (ref 26.0–34.0)
MCHC: 32.3 g/dL (ref 30.0–36.0)
MCV: 83.8 fL (ref 80.0–100.0)
Monocytes Absolute: 0.8 10*3/uL (ref 0.1–1.0)
Monocytes Relative: 4 %
Neutro Abs: 18.4 10*3/uL — ABNORMAL HIGH (ref 1.7–7.7)
Neutrophils Relative %: 87 %
Platelets: 300 10*3/uL (ref 150–400)
RBC: 5.51 MIL/uL (ref 4.22–5.81)
RDW: 13.7 % (ref 11.5–15.5)
WBC: 21 10*3/uL — ABNORMAL HIGH (ref 4.0–10.5)
nRBC: 0 % (ref 0.0–0.2)

## 2022-11-03 LAB — I-STAT CHEM 8, ED
BUN: 9 mg/dL (ref 6–20)
Calcium, Ion: 1.2 mmol/L (ref 1.15–1.40)
Chloride: 102 mmol/L (ref 98–111)
Creatinine, Ser: 0.9 mg/dL (ref 0.61–1.24)
Glucose, Bld: 98 mg/dL (ref 70–99)
HCT: 49 % (ref 39.0–52.0)
Hemoglobin: 16.7 g/dL (ref 13.0–17.0)
Potassium: 3.7 mmol/L (ref 3.5–5.1)
Sodium: 142 mmol/L (ref 135–145)
TCO2: 26 mmol/L (ref 22–32)

## 2022-11-03 LAB — HEPATIC FUNCTION PANEL
ALT: 12 U/L (ref 0–44)
AST: 29 U/L (ref 15–41)
Albumin: 4.6 g/dL (ref 3.5–5.0)
Alkaline Phosphatase: 75 U/L (ref 38–126)
Bilirubin, Direct: 0.2 mg/dL (ref 0.0–0.2)
Indirect Bilirubin: 0.6 mg/dL (ref 0.3–0.9)
Total Bilirubin: 0.8 mg/dL (ref 0.3–1.2)
Total Protein: 9.3 g/dL — ABNORMAL HIGH (ref 6.5–8.1)

## 2022-11-03 LAB — LIPASE, BLOOD: Lipase: 42 U/L (ref 11–51)

## 2022-11-03 SURGERY — APPENDECTOMY, LAPAROSCOPIC
Anesthesia: General | Site: Abdomen

## 2022-11-03 MED ORDER — MIDAZOLAM HCL 2 MG/2ML IJ SOLN
INTRAMUSCULAR | Status: AC
  Filled 2022-11-03: qty 2

## 2022-11-03 MED ORDER — OXYCODONE-ACETAMINOPHEN 5-325 MG PO TABS: 1.0000 | ORAL_TABLET | ORAL | 0 refills | Status: AC | PRN

## 2022-11-03 MED ORDER — LACTATED RINGERS IV SOLN: INTRAVENOUS | Status: DC | PRN

## 2022-11-03 MED ORDER — HYDROMORPHONE HCL 1 MG/ML IJ SOLN
0.2500 mg | INTRAMUSCULAR | Status: DC | PRN
  Administered 2022-11-03 (×2): 0.5 mg via INTRAVENOUS

## 2022-11-03 MED ORDER — IOHEXOL 300 MG/ML  SOLN
100.0000 mL | Freq: Once | INTRAMUSCULAR | Status: AC | PRN
  Administered 2022-11-03: 100 mL via INTRAVENOUS

## 2022-11-03 MED ORDER — SODIUM CHLORIDE 0.9 % IV BOLUS
1000.0000 mL | Freq: Once | INTRAVENOUS | Status: AC
  Administered 2022-11-03: 1000 mL via INTRAVENOUS

## 2022-11-03 MED ORDER — DEXAMETHASONE SODIUM PHOSPHATE 4 MG/ML IJ SOLN
INTRAMUSCULAR | Status: DC | PRN
  Administered 2022-11-03: 4 mg via INTRAVENOUS

## 2022-11-03 MED ORDER — ROCURONIUM BROMIDE 100 MG/10ML IV SOLN
INTRAVENOUS | Status: DC | PRN
  Administered 2022-11-03: 50 mg via INTRAVENOUS

## 2022-11-03 MED ORDER — SODIUM CHLORIDE (PF) 0.9 % IJ SOLN
INTRAMUSCULAR | Status: AC
  Filled 2022-11-03: qty 50

## 2022-11-03 MED ORDER — ACETAMINOPHEN 10 MG/ML IV SOLN
INTRAVENOUS | Status: AC
  Filled 2022-11-03: qty 100

## 2022-11-03 MED ORDER — BUPIVACAINE-EPINEPHRINE 0.25% -1:200000 IJ SOLN
INTRAMUSCULAR | Status: DC | PRN
  Administered 2022-11-03: 8 mL

## 2022-11-03 MED ORDER — CHLORHEXIDINE GLUCONATE CLOTH 2 % EX PADS
6.0000 | MEDICATED_PAD | Freq: Once | CUTANEOUS | Status: AC
  Administered 2022-11-03: 6 via TOPICAL

## 2022-11-03 MED ORDER — CHLORHEXIDINE GLUCONATE 0.12 % MT SOLN
15.0000 mL | Freq: Once | OROMUCOSAL | Status: AC
  Administered 2022-11-03: 15 mL via OROMUCOSAL

## 2022-11-03 MED ORDER — ACETAMINOPHEN 10 MG/ML IV SOLN
1000.0000 mg | Freq: Once | INTRAVENOUS | Status: DC | PRN
  Administered 2022-11-03: 1000 mg via INTRAVENOUS

## 2022-11-03 MED ORDER — LACTATED RINGERS IV SOLN
INTRAVENOUS | Status: DC | PRN
  Administered 2022-11-03: 1000 mL

## 2022-11-03 MED ORDER — 0.9 % SODIUM CHLORIDE (POUR BTL) OPTIME
TOPICAL | Status: DC | PRN
  Administered 2022-11-03: 1000 mL

## 2022-11-03 MED ORDER — MORPHINE SULFATE (PF) 4 MG/ML IV SOLN
4.0000 mg | Freq: Once | INTRAVENOUS | Status: AC
  Administered 2022-11-03: 4 mg via INTRAVENOUS
  Filled 2022-11-03: qty 1

## 2022-11-03 MED ORDER — ONDANSETRON HCL 4 MG/2ML IJ SOLN: 4.0000 mg | Freq: Once | INTRAMUSCULAR | Status: DC | PRN

## 2022-11-03 MED ORDER — LACTATED RINGERS IV SOLN: INTRAVENOUS | Status: DC

## 2022-11-03 MED ORDER — LIDOCAINE HCL (CARDIAC) PF 100 MG/5ML IV SOSY
PREFILLED_SYRINGE | INTRAVENOUS | Status: DC | PRN
  Administered 2022-11-03: 100 mg via INTRAVENOUS

## 2022-11-03 MED ORDER — DEXMEDETOMIDINE HCL IN NACL 80 MCG/20ML IV SOLN
INTRAVENOUS | Status: AC
  Filled 2022-11-03: qty 20

## 2022-11-03 MED ORDER — ONDANSETRON HCL 4 MG/2ML IJ SOLN
4.0000 mg | Freq: Once | INTRAMUSCULAR | Status: AC
  Administered 2022-11-03: 4 mg via INTRAVENOUS
  Filled 2022-11-03: qty 2

## 2022-11-03 MED ORDER — MIDAZOLAM HCL 5 MG/5ML IJ SOLN
INTRAMUSCULAR | Status: DC | PRN
  Administered 2022-11-03: 2 mg via INTRAVENOUS

## 2022-11-03 MED ORDER — MEPERIDINE HCL 50 MG/ML IJ SOLN: 6.2500 mg | INTRAMUSCULAR | Status: DC | PRN

## 2022-11-03 MED ORDER — BUPIVACAINE-EPINEPHRINE (PF) 0.25% -1:200000 IJ SOLN
INTRAMUSCULAR | Status: AC
  Filled 2022-11-03: qty 30

## 2022-11-03 MED ORDER — ONDANSETRON HCL 4 MG/2ML IJ SOLN
4.0000 mg | Freq: Four times a day (QID) | INTRAMUSCULAR | Status: DC | PRN
  Administered 2022-11-03: 4 mg via INTRAVENOUS

## 2022-11-03 MED ORDER — METRONIDAZOLE 500 MG/100ML IV SOLN
500.0000 mg | Freq: Once | INTRAVENOUS | Status: AC
  Administered 2022-11-03: 500 mg via INTRAVENOUS
  Filled 2022-11-03 (×2): qty 100

## 2022-11-03 MED ORDER — FENTANYL CITRATE (PF) 100 MCG/2ML IJ SOLN
INTRAMUSCULAR | Status: DC | PRN
  Administered 2022-11-03: 50 ug via INTRAVENOUS
  Administered 2022-11-03: 150 ug via INTRAVENOUS
  Administered 2022-11-03: 50 ug via INTRAVENOUS

## 2022-11-03 MED ORDER — DEXAMETHASONE SODIUM PHOSPHATE 10 MG/ML IJ SOLN
INTRAMUSCULAR | Status: AC
  Filled 2022-11-03: qty 1

## 2022-11-03 MED ORDER — FENTANYL CITRATE (PF) 250 MCG/5ML IJ SOLN
INTRAMUSCULAR | Status: AC
  Filled 2022-11-03: qty 5

## 2022-11-03 MED ORDER — LIDOCAINE HCL (PF) 2 % IJ SOLN
INTRAMUSCULAR | Status: AC
  Filled 2022-11-03: qty 10

## 2022-11-03 MED ORDER — DEXMEDETOMIDINE HCL IN NACL 200 MCG/50ML IV SOLN
INTRAVENOUS | Status: DC | PRN
  Administered 2022-11-03: 8 ug via INTRAVENOUS
  Administered 2022-11-03: 12 ug via INTRAVENOUS

## 2022-11-03 MED ORDER — SODIUM CHLORIDE 0.9 % IV SOLN
2.0000 g | Freq: Once | INTRAVENOUS | Status: AC
  Administered 2022-11-03: 2 g via INTRAVENOUS
  Filled 2022-11-03: qty 20

## 2022-11-03 MED ORDER — PROPOFOL 10 MG/ML IV BOLUS
INTRAVENOUS | Status: AC
  Filled 2022-11-03: qty 20

## 2022-11-03 MED ORDER — MORPHINE SULFATE (PF) 2 MG/ML IV SOLN: 2.0000 mg | INTRAVENOUS | Status: DC | PRN

## 2022-11-03 MED ORDER — ONDANSETRON HCL 4 MG/2ML IJ SOLN
INTRAMUSCULAR | Status: AC
  Filled 2022-11-03: qty 2

## 2022-11-03 MED ORDER — PROPOFOL 10 MG/ML IV BOLUS
INTRAVENOUS | Status: DC | PRN
  Administered 2022-11-03: 200 mg via INTRAVENOUS

## 2022-11-03 MED ORDER — HYDROMORPHONE HCL 1 MG/ML IJ SOLN
INTRAMUSCULAR | Status: AC
  Filled 2022-11-03: qty 1

## 2022-11-03 MED ORDER — SUGAMMADEX SODIUM 200 MG/2ML IV SOLN
INTRAVENOUS | Status: DC | PRN
  Administered 2022-11-03: 200 mg via INTRAVENOUS

## 2022-11-03 SURGICAL SUPPLY — 34 items
APPLIER CLIP 5 13 M/L LIGAMAX5 (MISCELLANEOUS)
APPLIER CLIP ROT 10 11.4 M/L (STAPLE)
BAG COUNTER SPONGE SURGICOUNT (BAG) IMPLANT
CHLORAPREP W/TINT 26 (MISCELLANEOUS) ×1 IMPLANT
CLIP APPLIE 5 13 M/L LIGAMAX5 (MISCELLANEOUS) IMPLANT
CLIP APPLIE ROT 10 11.4 M/L (STAPLE) IMPLANT
CLIP LIGATING HEMO LOK XL GOLD (MISCELLANEOUS) ×1 IMPLANT
COVER TRANSDUCER ULTRASND (DRAPES) ×1 IMPLANT
DERMABOND ADVANCED .7 DNX12 (GAUZE/BANDAGES/DRESSINGS) ×1 IMPLANT
DEVICE TROCAR PUNCTURE CLOSURE (ENDOMECHANICALS) ×1 IMPLANT
ELECT REM PT RETURN 15FT ADLT (MISCELLANEOUS) ×1 IMPLANT
ENDOLOOP SUT PDS II  0 18 (SUTURE)
ENDOLOOP SUT PDS II 0 18 (SUTURE) IMPLANT
GLOVE BIO SURGEON STRL SZ7.5 (GLOVE) ×1 IMPLANT
GOWN STRL REUS W/ TWL XL LVL3 (GOWN DISPOSABLE) ×2 IMPLANT
GOWN STRL REUS W/TWL XL LVL3 (GOWN DISPOSABLE) ×2
IRRIG SUCT STRYKERFLOW 2 WTIP (MISCELLANEOUS) ×1
IRRIGATION SUCT STRKRFLW 2 WTP (MISCELLANEOUS) ×1 IMPLANT
KIT BASIN OR (CUSTOM PROCEDURE TRAY) ×1 IMPLANT
KIT TURNOVER KIT A (KITS) IMPLANT
NDL INSUFFLATION 14GA 120MM (NEEDLE) ×1 IMPLANT
NEEDLE INSUFFLATION 14GA 120MM (NEEDLE) ×1 IMPLANT
PENCIL SMOKE EVACUATOR (MISCELLANEOUS) IMPLANT
SCISSORS LAP 5X35 DISP (ENDOMECHANICALS) ×1 IMPLANT
SET TUBE SMOKE EVAC HIGH FLOW (TUBING) ×1 IMPLANT
SLEEVE Z-THREAD 5X100MM (TROCAR) ×1 IMPLANT
SPIKE FLUID TRANSFER (MISCELLANEOUS) ×1 IMPLANT
SUT MNCRL AB 4-0 PS2 18 (SUTURE) ×1 IMPLANT
TOWEL OR 17X26 10 PK STRL BLUE (TOWEL DISPOSABLE) ×1 IMPLANT
TOWEL OR NON WOVEN STRL DISP B (DISPOSABLE) ×1 IMPLANT
TRAY FOLEY MTR SLVR 16FR STAT (SET/KITS/TRAYS/PACK) IMPLANT
TRAY LAPAROSCOPIC (CUSTOM PROCEDURE TRAY) ×1 IMPLANT
TROCAR 11X100 Z THREAD (TROCAR) ×1 IMPLANT
TROCAR Z-THREAD OPTICAL 5X100M (TROCAR) ×1 IMPLANT

## 2022-11-03 NOTE — Discharge Instructions (Addendum)
[facility]  Please arrive at least 30 min before your appointment to complete your check in paperwork.  If you are unable to arrive 30 min prior to your appointment time we may have to cancel or reschedule you. LAPAROSCOPIC SURGERY: POST OP INSTRUCTIONS Always review your discharge instruction sheet given to you by the facility where your surgery was performed. IF YOU HAVE DISABILITY OR FAMILY LEAVE FORMS, YOU MUST BRING THEM TO THE OFFICE FOR PROCESSING.   DO NOT GIVE THEM TO YOUR DOCTOR.  PAIN CONTROL  First take acetaminophen (Tylenol) AND/or ibuprofen (Advil) to control your pain after surgery.  Follow directions on package.  Taking acetaminophen (Tylenol) and/or ibuprofen (Advil) regularly after surgery will help to control your pain and lower the amount of prescription pain medication you may need.  You should not take more than 4,000 mg (4 grams) of acetaminophen (Tylenol) in 24 hours.  You should not take ibuprofen (Advil), aleve, motrin, naprosyn or other NSAIDS if you have a history of stomach ulcers or chronic kidney disease.  A prescription for pain medication may be given to you upon discharge.  Take your pain medication as prescribed, if you still have uncontrolled pain after taking acetaminophen (Tylenol) or ibuprofen (Advil). Use ice packs to help control pain. If you need a refill on your pain medication, please contact your pharmacy.  They will contact our office to request authorization. Prescriptions will not be filled after 5pm or on week-ends.  HOME MEDICATIONS Take your usually prescribed medications unless otherwise directed.  DIET You should follow a light diet the first few days after arrival home.  Be sure to include lots of fluids daily. Avoid fatty, fried foods.   CONSTIPATION It is common to experience some constipation after surgery and if you are taking pain medication.  Increasing fluid intake and taking a stool softener (such as Colace)  will usually help or prevent this problem from occurring.  A mild laxative (Milk of Magnesia or Miralax) should be taken according to package instructions if there are no bowel movements after 48 hours.  WOUND/INCISION CARE Most patients will experience some swelling and bruising in the area of the incisions.  Ice packs will help.  Swelling and bruising can take several days to resolve.  Unless discharge instructions indicate otherwise, follow guidelines below  STERI-STRIPS - you may remove your outer bandages 48 hours after surgery, and you may shower at that time.  You have steri-strips (small skin tapes) in place directly over the incision.  These strips should be left on the skin for 7-10 days.   DERMABOND/SKIN GLUE - you may shower in 24 hours.  The glue will flake off over the next 2-3 weeks. Any sutures or staples will be removed at the office during your follow-up visit.  ACTIVITIES You may resume regular (light) daily activities beginning the next day--such as daily self-care, walking, climbing stairs--gradually increasing activities as tolerated.  You may have sexual intercourse when it is comfortable.  Refrain from any heavy lifting or straining until approved by your doctor. You may drive when you are no longer taking prescription pain medication, you can comfortably wear a seatbelt, and you can safely maneuver your car and apply brakes.  FOLLOW-UP You should see your doctor in the office for a follow-up appointment approximately 2-3 weeks after your surgery.  You should have been given your post-op/follow-up appointment when your surgery was scheduled.  If you did not receive a post-op/follow-up appointment, make sure   that you call for this appointment within a day or two after you arrive home to insure a convenient appointment time.  OTHER INSTRUCTIONS  WHEN TO CALL YOUR DOCTOR: Fever over 101.0 Inability to urinate Continued bleeding from incision. Increased pain, redness, or  drainage from the incision. Increasing abdominal pain  The clinic staff is available to answer your questions during regular business hours.  Please don't hesitate to call and ask to speak to one of the nurses for clinical concerns.  If you have a medical emergency, go to the nearest emergency room or call 911.  A surgeon from [facility] is always on call at the hospital. [address], [address], [place], [place]  [address] ? [address] [address], [place], [place]   [address] [phone] ? [phone] ? [phone]   

## 2022-11-03 NOTE — H&P (Signed)
[facility] Admission Note  Scott Wiggins October 18, 2000  [id number].    Requesting MD: Varney Biles Chief Complaint/Reason for Consult: appendicitis   HPI:  Scott Wiggins is a 22 y.o. male who presented to [facility] today complaining of recurrent abdominal pain. States that last night he developed global abdominal pain associated with nausea and vomiting. Pain then localized to the RLQ. He also had associated constipation and cold chills. States that he has had similar episodes once a month since September 2023. He was seen in the ED 1 month ago after an episode and had a CT scan that showed a thickened appendix but no other signs of appendicitis; he did not have any RLQ tenderness at the time and his symptoms resolved so he was discharge home. In the ED today his CT scan shows a dilated appendix to 54m (slightly enlarged compared to previous CT scan), fluid-filled with mild surrounding inflammatory changes concerning for acute appendicitis. WBC 21. General surgery asked to see.  Abdominal surgical history: none Lives at home with his mother Not currently working or in school   History reviewed. No pertinent family history.  Past Medical History:  Diagnosis Date   Seasonal allergies     Past Surgical History:  Procedure Laterality Date   PILONIDAL CYST EXCISION N/A 05/21/2020   Procedure: EXCISION PILONIDAL CYST;  Surgeon: CErroll Luna MD;  Location: [facility]  Service: General;  Laterality: N/A;    Social History:  reports that he has been smoking cigarettes. He has been smoking an average of .25 packs per day. He has never used smokeless tobacco. He reports current drug use. Drug: Marijuana. He reports that he does not drink alcohol.  Allergies:  Allergies  Allergen Reactions   Salmon [Fish Allergy] Rash    Medications Prior to Admission  Medication Sig Dispense Refill   ondansetron (ZOFRAN) 4 MG tablet Take 1 tablet (4 mg  total) by mouth every 6 (six) hours. 12 tablet 0   clotrimazole (LOTRIMIN) 1 % cream Apply to affected area 2 times daily (Patient not taking: Reported on 10/03/2022) 60 g 0   Doxylamine Succinate, Sleep, (SLEEP AID PO) Take 1 tablet by mouth once. (Patient not taking: Reported on 11/03/2022)     Fructose-Dextrose-Phosphor Acd (NAUSEA CONTROL PO) Take 1 tablet by mouth once. (Patient not taking: Reported on 11/03/2022)     HYDROcodone-acetaminophen (NORCO/VICODIN) 5-325 MG tablet Take 1 tablet by mouth every 6 (six) hours as needed for severe pain. (Patient not taking: Reported on 10/03/2022) 10 tablet 0   naproxen (NAPROSYN) 500 MG tablet Take 1 tablet (500 mg total) by mouth 2 (two) times daily with a meal. (Patient not taking: Reported on 10/03/2022) 30 tablet 0    Prior to Admission medications   Medication Sig Start Date End Date Taking? Authorizing Provider  ondansetron (ZOFRAN) 4 MG tablet Take 1 tablet (4 mg total) by mouth every 6 (six) hours. 10/03/22  Yes FMarcello Fennel PA-C  clotrimazole (LOTRIMIN) 1 % cream Apply to affected area 2 times daily Patient not taking: Reported on 10/03/2022 11/30/20   CHughie Closs PA-C  Doxylamine Succinate, Sleep, (SLEEP AID PO) Take 1 tablet by mouth once. Patient not taking: Reported on 11/03/2022    [provider]  Fructose-Dextrose-Phosphor Acd (NAUSEA CONTROL PO) Take 1 tablet by mouth once. Patient not taking: Reported on 11/03/2022    [provider]  HYDROcodone-acetaminophen (NORCO/VICODIN) 5-325 MG tablet Take 1 tablet by mouth every 6 (six) hours as  needed for severe pain. Patient not taking: Reported on 10/03/2022 10/03/20   Jaynee Eagles, PA-C  naproxen (NAPROSYN) 500 MG tablet Take 1 tablet (500 mg total) by mouth 2 (two) times daily with a meal. Patient not taking: Reported on 10/03/2022 10/03/20   Jaynee Eagles, PA-C  albuterol (VENTOLIN HFA) 108 (90 Base) MCG/ACT inhaler Inhale 2 puffs into the lungs every 4 (four) hours as needed for  wheezing or shortness of breath. 01/27/20 10/03/20  McDonald, Mia A, PA-C    Blood pressure (!) 130/94, pulse 73, temperature 97.8 F (36.6 C), temperature source Oral, resp. rate 14, weight 74 kg, SpO2 100 %. Physical Exam: General: pleasant, WD/WN male who is laying in bed in NAD HEENT: head is normocephalic, atraumatic.  Sclera are noninjected.  Pupils equal and round.  Ears and nose without any masses or lesions.  Mouth is pink and moist. Dentition fair Heart: regular, rate, and rhythm  Lungs: CTAB, no wheezes, rhonchi, or rales noted.  Respiratory effort nonlabored Abd: soft, ND, +BS, no masses, hernias, or organomegaly. Mild RLQ TTP to deep palpation, no rebound or guarding MS: no BUE/BLE edema, calves soft and nontender Skin: warm and dry Psych: A&Ox4 with an appropriate affect Neuro: MAEs, no gross motor or sensory deficits BUE/BLE  Results for orders placed or performed during the hospital encounter of 11/03/22 (from the past 48 hour(s))  CBC with Differential     Status: Abnormal   Collection Time: 11/03/22 11:47 AM  Result Value Ref Range   WBC 21.0 (H) 4.0 - 10.5 K/uL   RBC 5.51 4.22 - 5.81 MIL/uL   Hemoglobin 14.9 13.0 - 17.0 g/dL   HCT 46.2 39.0 - 52.0 %   MCV 83.8 80.0 - 100.0 fL   MCH 27.0 26.0 - 34.0 pg   MCHC 32.3 30.0 - 36.0 g/dL   RDW 13.7 11.5 - 15.5 %   Platelets 300 150 - 400 K/uL   nRBC 0.0 0.0 - 0.2 %   Neutrophils Relative % 87 %   Neutro Abs 18.4 (H) 1.7 - 7.7 K/uL   Lymphocytes Relative 8 %   Lymphs Abs 1.6 0.7 - 4.0 K/uL   Monocytes Relative 4 %   Monocytes Absolute 0.8 0.1 - 1.0 K/uL   Eosinophils Relative 0 %   Eosinophils Absolute 0.0 0.0 - 0.5 K/uL   Basophils Relative 0 %   Basophils Absolute 0.1 0.0 - 0.1 K/uL   Immature Granulocytes 1 %   Abs Immature Granulocytes 0.11 (H) 0.00 - 0.07 K/uL    Comment: Performed at [facility], [place] [address], [place], [place] [address]  Hepatic function panel     Status: Abnormal    Collection Time: 11/03/22 11:47 AM  Result Value Ref Range   Total Protein 9.3 (H) 6.5 - 8.1 g/dL   Albumin 4.6 3.5 - 5.0 g/dL   AST 29 15 - 41 U/L   ALT 12 0 - 44 U/L   Alkaline Phosphatase 75 38 - 126 U/L   Total Bilirubin 0.8 0.3 - 1.2 mg/dL   Bilirubin, Direct 0.2 0.0 - 0.2 mg/dL   Indirect Bilirubin 0.6 0.3 - 0.9 mg/dL    Comment: Performed at [facility], [place] [address], [place], [place] [address]  Lipase, blood     Status: None   Collection Time: 11/03/22 11:47 AM  Result Value Ref Range   Lipase 42 11 - 51 U/L    Comment: Performed at [facility], [address] [place], [place] [address]  I-stat chem 8, ED (not at [facility], [facility] or [facility])     Status: None   Collection Time: 11/03/22 11:54 AM  Result Value Ref Range   Sodium 142 135 - 145 mmol/L   Potassium 3.7 3.5 - 5.1 mmol/L   Chloride 102 98 - 111 mmol/L   BUN 9 6 - 20 mg/dL   Creatinine, Ser 0.90 0.61 - 1.24 mg/dL   Glucose, Bld 98 70 - 99 mg/dL    Comment: Glucose reference range applies only to samples taken after fasting for at least 8 hours.   Calcium, Ion 1.20 1.15 - 1.40 mmol/L   TCO2 26 22 - 32 mmol/L   Hemoglobin 16.7 13.0 - 17.0 g/dL   HCT 49.0 39.0 - 52.0 %   CT ABDOMEN PELVIS W CONTRAST  Result Date: 11/03/2022 CLINICAL DATA:  Acute right lower quadrant abdominal pain. EXAM: CT ABDOMEN AND PELVIS WITH CONTRAST TECHNIQUE: Multidetector CT imaging of the abdomen and pelvis was performed using the standard protocol following bolus administration of intravenous contrast. RADIATION DOSE REDUCTION: This exam was performed according to the departmental dose-optimization program which includes automated exposure control, adjustment of the mA and/or kV according to patient size and/or use of iterative reconstruction technique. CONTRAST:  146m OMNIPAQUE IOHEXOL 300 MG/ML  SOLN COMPARISON:  October 03, 2022. FINDINGS: Lower chest: No acute abnormality. Hepatobiliary: No focal liver  abnormality is seen. No gallstones, gallbladder wall thickening, or biliary dilatation. Pancreas: Unremarkable. No pancreatic ductal dilatation or surrounding inflammatory changes. Spleen: Normal in size without focal abnormality. Adrenals/Urinary Tract: Adrenal glands are unremarkable. Kidneys are normal, without renal calculi, focal lesion, or hydronephrosis. Bladder is unremarkable. Stomach/Bowel: The stomach is unremarkable. There is no evidence of bowel obstruction or inflammation. The appendix is dilated measuring 11 mm in maximum diameter. It also is fluid-filled with mild surrounding inflammation, concerning for acute appendicitis. No abscess is noted. It appears to be mildly enlarged compared to prior exam. It is also retrocecal in position. Vascular/Lymphatic: No significant vascular findings are present. Stable mildly enlarged bilateral external iliac and inguinal lymph nodes are noted which most likely are reactive or inflammatory in etiology. Reproductive: Prostate is unremarkable. Other: No abdominal wall hernia. No abdominopelvic ascites. Musculoskeletal: No acute or significant osseous findings. IMPRESSION: The appendix is dilated measuring 11 mm in maximum diameter, which is slightly enlarged compared to prior exam. It is also fluid-filled with mild surrounding inflammatory change, concerning for acute appendicitis in the appropriate clinical setting. It is also retrocecal in position. No definite abscess is noted. Electronically Signed   By: JMarijo ConceptionM.D.   On: 11/03/2022 13:30      Assessment/Plan Acute appendicitis - Presentation somewhat abnormal for appendicitis, but given recurrence of symptoms, RLQ tenderness on exam, and CT scan showing a dilated, fluid-filled appendix to 163m(slightly enlarged compared to previous CT scan) with mild surrounding inflammatory changes concerning for acute appendicitis, recommend admission for laparoscopic appendectomy. I have discussed the  procedure and risks of appendectomy. The risks include but are not limited to bleeding, infection, wound problems, anesthesia, injury to intra-abdominal organs, possibility of postoperative ileus. He seems to understand and agrees with the plan. Keep NPO. IV rocephin/flagyl have been ordered. Plan for surgery this afternoon and possible discharge from PACU.   I reviewed ED provider notes, last 24 h vitals and pain scores, last 48 h intake and output, last 24 h labs and trends, and last 24 h imaging results.  Wellington Hampshire, [facility] 11/03/2022, 3:13 PM Please see Amion for pager number during day hours 7:00am-4:30pm

## 2022-11-03 NOTE — ED Notes (Signed)
Pt provided with chlorhexidine wipes. Instructed to change into gown. Short stay on the way to get patient.

## 2022-11-03 NOTE — ED Triage Notes (Signed)
C/o RLQ abd pain that started this am with n/v. Pain with palpitation.  Emesis noted in triage.  Pt reports using marijuana last night.  Pt seen on 2/4 for same with improvement.

## 2022-11-03 NOTE — Transfer of Care (Signed)
Immediate Anesthesia Transfer of Care Note  Patient: Scott Wiggins  Procedure(s) Performed: APPENDECTOMY LAPAROSCOPIC (Abdomen)  Patient Location: PACU  Anesthesia Type:General  Level of Consciousness: awake and alert   Airway & Oxygen Therapy: Patient Spontanous Breathing  Post-op Assessment: Report given to RN and Post -op Vital signs reviewed and stable  Post vital signs: Reviewed and stable  Last Vitals:  Vitals Value Taken Time  BP 128/76 11/03/22 1631  Temp    Pulse 80 11/03/22 1635  Resp 18 11/03/22 1635  SpO2 96 % 11/03/22 1635  Vitals shown include unvalidated device data.  Last Pain:  Vitals:   11/03/22 1516  TempSrc:   PainSc: 0-No pain         Complications: No notable events documented.

## 2022-11-03 NOTE — Op Note (Signed)
11/03/2022  4:18 PM  PATIENT:  Scott Wiggins  22 y.o. male  PRE-OPERATIVE DIAGNOSIS:  APPENDICITIS  POST-OPERATIVE DIAGNOSIS:  acute appendicitis  PROCEDURE:  Procedure(s): APPENDECTOMY LAPAROSCOPIC (N/A)  SURGEON:  Surgeon(s) and Role:    * Ralene Ok, MD - Primary  ANESTHESIA:   local and general  EBL:  minimal   BLOOD ADMINISTERED:none  DRAINS: none   LOCAL MEDICATIONS USED:  BUPIVICAINE   SPECIMEN:  Source of Specimen:  appendix  DISPOSITION OF SPECIMEN:  PATHOLOGY  COUNTS:  YES  TOURNIQUET:  * No tourniquets in log *  DICTATION: .Dragon Dictation  Complications: none  Counts: reported as correct x 2  Findings:  The patient had a acutely inflamed, non perforated appendix  Specimen: Appendix  Indications for procedure:  The patient is a 22 year old male with a history of periumbilical pain localized in the right lower quadrant patient had a CT scan which revealed signs consistent with acute appendicitis the patient back in for laparoscopic appendectomy.  Details of the procedure:The patient was taken back to the operating room. The patient was placed in supine position with bilateral SCDs in place. The patient was prepped and draped in the usual sterile fashion.  After appropriate anitbiotics were confirmed, a time-out was confirmed and all facts were verified.    A pneumoperitoneum of 14 mmHg was obtained via a Veress needle technique in the left lower quadrant quadrant.  A 5 mm trocar and 5 mm camera then placed intra-abdominally there is no injury to any intra-abdominal organs a 10 mm infraumbilical port was placed and direct visualization as was a 5 mm port in the suprapubic area.   The appendix was identified and seen to be non-perforated.  The appendix was cleaned down to the appendiceal base. The mesoappendix was then incised and the appendiceal artery was cauterized.  The the appendiceal base was clean.  A gold hemoclip was placed proximallyx2  and one distally and the appendix was transected between these 2. A retrieval bag was then placed into the abdomen and the specimen placed in the bag. The appendiceal stump was cauterized. We evacuate the fluid from the pelvis until the effluent was clear.  The appendix and retrieval  bag was then retrieved via the supraumbilical port. #1 Vicryl was used to reapproximate the fascia at the umbilical port site x1. The skin was reapproximated all port sites 3-0 Monocryl subcuticular fashion. The skin was dressed with Dermabond.  The patient was awakened from general anesthesia was taken to recovery room in stable condition.     PLAN OF CARE: Discharge to home after PACU  PATIENT DISPOSITION:  PACU - hemodynamically stable.   Delay start of Pharmacological VTE agent (>24hrs) due to surgical blood loss or risk of bleeding: not applicable

## 2022-11-03 NOTE — ED Notes (Signed)
Gen sx at bedside talking with patient.

## 2022-11-03 NOTE — Anesthesia Postprocedure Evaluation (Signed)
Anesthesia Post Note  Patient: Scott Wiggins  Procedure(s) Performed: APPENDECTOMY LAPAROSCOPIC (Abdomen)     Patient location during evaluation: PACU Anesthesia Type: General Level of consciousness: awake and sedated Pain management: pain level controlled Vital Signs Assessment: post-procedure vital signs reviewed and stable Respiratory status: spontaneous breathing Postop Assessment: no apparent nausea or vomiting Anesthetic complications: no  No notable events documented.  Last Vitals:  Vitals:   11/03/22 1645 11/03/22 1700  BP: (!) 147/80 114/77  Pulse: 83 72  Resp: (!) 22 11  Temp: 36.7 C   SpO2: 93% 98%    Last Pain:  Vitals:   11/03/22 1700  TempSrc:   PainSc: [place]

## 2022-11-03 NOTE — Anesthesia Preprocedure Evaluation (Signed)
Anesthesia Evaluation  Patient identified by MRN, date of birth, ID band Patient awake    Reviewed: Allergy & Precautions, NPO status , Patient's Chart, lab work & pertinent test results  Airway Mallampati: I       Dental no notable dental hx.    Pulmonary Current Smoker and Patient abstained from smoking.   Pulmonary exam normal        Cardiovascular negative cardio ROS Normal cardiovascular exam     Neuro/Psych negative neurological ROS  negative psych ROS   GI/Hepatic Neg liver ROS,,,  Endo/Other  negative endocrine ROS    Renal/GU negative Renal ROS  negative genitourinary   Musculoskeletal negative musculoskeletal ROS (+)    Abdominal  (+)  Abdomen: tender.   Peds  Hematology   Anesthesia Other Findings   Reproductive/Obstetrics                             Anesthesia Physical Anesthesia Plan  ASA: 2  Anesthesia Plan: General   Post-op Pain Management: Toradol IV (intra-op)*   Induction: Intravenous  PONV Risk Score and Plan: Ondansetron, Dexamethasone and Midazolam  Airway Management Planned: Oral ETT  Additional Equipment: None  Intra-op Plan:   Post-operative Plan: Extubation in OR  Informed Consent: I have reviewed the patients History and Physical, chart, labs and discussed the procedure including the risks, benefits and alternatives for the proposed anesthesia with the patient or authorized representative who has indicated his/her understanding and acceptance.     Dental advisory given  Plan Discussed with: CRNA  Anesthesia Plan Comments:        Anesthesia Quick Evaluation

## 2022-11-03 NOTE — ED Provider Notes (Signed)
[facility] Provider Note   CSN: [id number] Arrival date & time: 11/03/22  1042     History  Chief Complaint  Patient presents with   Abdominal Pain    Scott Wiggins is a 22 y.o. male.  HPI     22 year old male comes in with chief plaint of right lower quadrant abdominal pain. Patient states that his current pain started sometime in the middle of the night.  Pain woke him up.  Pain is located in the lower quadrant, he was seen in the emergency room sometime last month and was advised to follow-up with general surgery but he has not.  Patient continues to have pain right now.  He had episode of diarrhea prior to ED arrival and has nausea.  Home Medications Prior to Admission medications   Medication Sig Start Date End Date Taking? Authorizing Provider  clotrimazole (LOTRIMIN) 1 % cream Apply to affected area 2 times daily Patient not taking: Reported on 10/03/2022 11/30/20   Hughie Closs, PA-C  Doxylamine Succinate, Sleep, (SLEEP AID PO) Take 1 tablet by mouth once.    [provider]  Fructose-Dextrose-Phosphor Acd (NAUSEA CONTROL PO) Take 1 tablet by mouth once.    [provider]  HYDROcodone-acetaminophen (NORCO/VICODIN) 5-325 MG tablet Take 1 tablet by mouth every 6 (six) hours as needed for severe pain. Patient not taking: Reported on 10/03/2022 10/03/20   Jaynee Eagles, PA-C  naproxen (NAPROSYN) 500 MG tablet Take 1 tablet (500 mg total) by mouth 2 (two) times daily with a meal. Patient not taking: Reported on 10/03/2022 10/03/20   Jaynee Eagles, PA-C  ondansetron (ZOFRAN) 4 MG tablet Take 1 tablet (4 mg total) by mouth every 6 (six) hours. 10/03/22   Marcello Fennel, PA-C  albuterol (VENTOLIN HFA) 108 (90 Base) MCG/ACT inhaler Inhale 2 puffs into the lungs every 4 (four) hours as needed for wheezing or shortness of breath. 01/27/20 10/03/20  McDonald, Mia A, PA-C      Allergies    Hyman Hopes allergy]     Review of Systems   Review of Systems  All other systems reviewed and are negative.   Physical Exam Updated Vital Signs BP (!) 130/94   Pulse 73   Temp 97.9 F (36.6 C) (Oral)   Resp 14   Wt 74 kg   SpO2 100%   BMI 24.09 kg/m  Physical Exam Vitals and nursing note reviewed.  Constitutional:      Appearance: He is well-developed.  HENT:     Head: Atraumatic.  Cardiovascular:     Rate and Rhythm: Normal rate.  Pulmonary:     Effort: Pulmonary effort is normal.  Abdominal:     Tenderness: There is abdominal tenderness in the right lower quadrant. There is guarding. There is no rebound. Positive signs include McBurney's sign.  Musculoskeletal:     Cervical back: Neck supple.  Skin:    General: Skin is warm.  Neurological:     Mental Status: He is alert and oriented to person, place, and time.     ED Results / Procedures / Treatments   Labs (all labs ordered are listed, but only abnormal results are displayed) Labs Reviewed  CBC WITH DIFFERENTIAL/PLATELET - Abnormal; Notable for the following components:      Result Value   WBC 21.0 (*)    Neutro Abs 18.4 (*)    Abs Immature Granulocytes 0.11 (*)    All other components within normal limits  HEPATIC FUNCTION PANEL - Abnormal; Notable for the following components:   Total Protein 9.3 (*)    All other components within normal limits  LIPASE, BLOOD  I-STAT CHEM 8, ED    EKG None  Radiology CT ABDOMEN PELVIS W CONTRAST  Result Date: 11/03/2022 CLINICAL DATA:  Acute right lower quadrant abdominal pain. EXAM: CT ABDOMEN AND PELVIS WITH CONTRAST TECHNIQUE: Multidetector CT imaging of the abdomen and pelvis was performed using the standard protocol following bolus administration of intravenous contrast. RADIATION DOSE REDUCTION: This exam was performed according to the departmental dose-optimization program which includes automated exposure control, adjustment of the mA and/or kV according to patient size and/or use  of iterative reconstruction technique. CONTRAST:  12m OMNIPAQUE IOHEXOL 300 MG/ML  SOLN COMPARISON:  October 03, 2022. FINDINGS: Lower chest: No acute abnormality. Hepatobiliary: No focal liver abnormality is seen. No gallstones, gallbladder wall thickening, or biliary dilatation. Pancreas: Unremarkable. No pancreatic ductal dilatation or surrounding inflammatory changes. Spleen: Normal in size without focal abnormality. Adrenals/Urinary Tract: Adrenal glands are unremarkable. Kidneys are normal, without renal calculi, focal lesion, or hydronephrosis. Bladder is unremarkable. Stomach/Bowel: The stomach is unremarkable. There is no evidence of bowel obstruction or inflammation. The appendix is dilated measuring 11 mm in maximum diameter. It also is fluid-filled with mild surrounding inflammation, concerning for acute appendicitis. No abscess is noted. It appears to be mildly enlarged compared to prior exam. It is also retrocecal in position. Vascular/Lymphatic: No significant vascular findings are present. Stable mildly enlarged bilateral external iliac and inguinal lymph nodes are noted which most likely are reactive or inflammatory in etiology. Reproductive: Prostate is unremarkable. Other: No abdominal wall hernia. No abdominopelvic ascites. Musculoskeletal: No acute or significant osseous findings. IMPRESSION: The appendix is dilated measuring 11 mm in maximum diameter, which is slightly enlarged compared to prior exam. It is also fluid-filled with mild surrounding inflammatory change, concerning for acute appendicitis in the appropriate clinical setting. It is also retrocecal in position. No definite abscess is noted. Electronically Signed   By: JMarijo ConceptionM.D.   On: 11/03/2022 13:30    Procedures Procedures    Medications Ordered in ED Medications  cefTRIAXone (ROCEPHIN) 2 g in sodium chloride 0.9 % 100 mL IVPB (has no administration in time range)    And  metroNIDAZOLE (FLAGYL) IVPB 500 mg  (has no administration in time range)  morphine (PF) 4 MG/ML injection 4 mg (4 mg Intravenous Given 11/03/22 1158)  ondansetron (ZOFRAN) injection 4 mg (4 mg Intravenous Given 11/03/22 1156)  sodium chloride 0.9 % bolus 1,000 mL (1,000 mLs Intravenous New Bag/Given 11/03/22 1157)  sodium chloride (PF) 0.9 % injection (  Given by Other 11/03/22 1302)  iohexol (OMNIPAQUE) 300 MG/ML solution 100 mL (100 mLs Intravenous Contrast Given 11/03/22 1301)    ED Course/ Medical Decision Making/ A&P                             Medical Decision Making Problems Addressed: Acute appendicitis with localized peritonitis, without perforation, abscess, or gangrene: acute illness or injury  Amount and/or Complexity of Data Reviewed Labs: ordered. Radiology: ordered.  Risk Prescription drug management. Decision regarding hospitalization. Emergency major surgery.   22year old male comes in with chief complaint of abdominal pain.  He has no significant medical history.  He was seen in the emergency room last month, had CT abdomen pelvis that showed dilated appendix, but no evidence of acute appendicitis.  It  appears that patient was provided follow-up with general surgery, but he has not.  He was doing well until yesterday when he started having right lower quadrant tenderness in the middle of the night.  On exam patient has both right lower quadrant and right upper quadrant tenderness, positive McBurney's.  I have reviewed the CT scan from previous visit.  CT scan at that time showed dilated appendix.  Gallbladder looks fine.  Exam more concerning for acute appendicitis.  CT abdomen and pelvis has been ordered.  Differential diagnosis for this patient includes acute appendicitis, cholecystitis, cholelithiasis, renal stone, severe constipation, intra-abdominal infection.  There is no family history of IBD which is reassuring.   2:20 PM I have independently interpreted patient's CAT scan.  There is no evidence of  perforation.  Radiology team confirms dilated appendix with periappendiceal fluid.  Radiographic concerns for acute appendicitis as well.  Results of the ER workup has been discussed with the patient.  He is stable for admission.  Patient clinically not septic.  IV ceftriaxone and Flagyl ordered.  CBC reveals white count that is only slightly elevated.  No endorgan damage.  Final Clinical Impression(s) / ED Diagnoses Final diagnoses:  Acute appendicitis with localized peritonitis, without perforation, abscess, or gangrene    Rx / DC Orders ED Discharge Orders     None         Varney Biles, MD 11/03/22 1420

## 2022-11-03 NOTE — Anesthesia Procedure Notes (Signed)
Procedure Name: Intubation Date/Time: 11/03/2022 4:02 PM  Performed by: Timoteo Expose, CRNAPre-anesthesia Checklist: Patient identified, Emergency Drugs available, Suction available and Patient being monitored Patient Re-evaluated:Patient Re-evaluated prior to induction Oxygen Delivery Method: Circle system utilized Preoxygenation: Pre-oxygenation with 100% oxygen Induction Type: IV induction Ventilation: Mask ventilation without difficulty Laryngoscope Size: Mac and 4 Grade View: Grade I Tube type: Oral Tube size: 7.5 mm Number of attempts: 1 Airway Equipment and Method: Stylet and Oral airway Placement Confirmation: ETT inserted through vocal cords under direct vision, positive ETCO2 and breath sounds checked- equal and bilateral Secured at: 21 cm Tube secured with: Tape Dental Injury: Teeth and Oropharynx as per pre-operative assessment

## 2022-11-04 ENCOUNTER — Encounter (HOSPITAL_COMMUNITY): Payer: Self-pay | Admitting: General Surgery

## 2022-11-05 LAB — SURGICAL PATHOLOGY

## 2023-05-24 ENCOUNTER — Other Ambulatory Visit: Payer: Self-pay

## 2023-05-24 ENCOUNTER — Encounter (HOSPITAL_COMMUNITY): Payer: Self-pay

## 2023-05-24 ENCOUNTER — Emergency Department (HOSPITAL_COMMUNITY): Payer: Medicaid Other

## 2023-05-24 ENCOUNTER — Emergency Department (HOSPITAL_COMMUNITY)
Admission: EM | Admit: 2023-05-24 | Discharge: 2023-05-24 | Disposition: A | Discharge status: Home / Self Care | Payer: Medicaid Other | Attending: Emergency Medicine | Admitting: Emergency Medicine

## 2023-05-24 DIAGNOSIS — R509 Fever, unspecified: Secondary | ICD-10-CM | POA: Diagnosis not present

## 2023-05-24 DIAGNOSIS — B349 Viral infection, unspecified: Secondary | ICD-10-CM

## 2023-05-24 DIAGNOSIS — Z1152 Encounter for screening for COVID-19: Secondary | ICD-10-CM | POA: Insufficient documentation

## 2023-05-24 DIAGNOSIS — R0981 Nasal congestion: Secondary | ICD-10-CM | POA: Diagnosis present

## 2023-05-24 LAB — RESP PANEL BY RT-PCR (RSV, FLU A&B, COVID)  RVPGX2
Influenza A by PCR: NEGATIVE
Influenza B by PCR: NEGATIVE
Resp Syncytial Virus by PCR: NEGATIVE
SARS Coronavirus 2 by RT PCR: NEGATIVE

## 2023-05-24 MED ORDER — ACETAMINOPHEN 500 MG PO TABS
1000.0000 mg | ORAL_TABLET | Freq: Once | ORAL | Status: AC
  Administered 2023-05-24: 1000 mg via ORAL
  Filled 2023-05-24: qty 2

## 2023-05-24 NOTE — ED Provider Notes (Deleted)
[place] EMERGENCY DEPARTMENT AT [facility] Provider Note   CSN: [id number] Arrival date & time: 05/24/23  1478     History  Chief Complaint  Patient presents with   Generalized Body Aches   HPI Scott Wiggins is a 22 y.o. male presenting for fever headache and generalized bodyaches for 4 days along with nasal congestion.  States overall symptoms have improved but have just been lingering which prompted his evaluation today.  Denies shortness of breath or chest pain.  Endorses 1 recent family member who was sick with similar symptoms.  HPI     Home Medications Prior to Admission medications   Medication Sig Start Date End Date Taking? Authorizing Provider  clotrimazole (LOTRIMIN) 1 % cream Apply to affected area 2 times daily Patient not taking: Reported on 10/03/2022 11/30/20   Rushie Chestnut, PA-C  Doxylamine Succinate, Sleep, (SLEEP AID PO) Take 1 tablet by mouth once. Patient not taking: Reported on 11/03/2022    [provider]  Fructose-Dextrose-Phosphor Acd (NAUSEA CONTROL PO) Take 1 tablet by mouth once. Patient not taking: Reported on 11/03/2022    [provider]  HYDROcodone-acetaminophen (NORCO/VICODIN) 5-325 MG tablet Take 1 tablet by mouth every 6 (six) hours as needed for severe pain. Patient not taking: Reported on 10/03/2022 10/03/20   Wallis Bamberg, PA-C  naproxen (NAPROSYN) 500 MG tablet Take 1 tablet (500 mg total) by mouth 2 (two) times daily with a meal. Patient not taking: Reported on 10/03/2022 10/03/20   Wallis Bamberg, PA-C  ondansetron (ZOFRAN) 4 MG tablet Take 1 tablet (4 mg total) by mouth every 6 (six) hours. 10/03/22   Carroll Sage, PA-C  oxyCODONE-acetaminophen (PERCOCET) 5-325 MG tablet Take 1 tablet by mouth every 4 (four) hours as needed for severe pain. 11/03/22 11/03/23  Axel Filler, MD  albuterol (VENTOLIN HFA) 108 (90 Base) MCG/ACT inhaler Inhale 2 puffs into the lungs every 4 (four) hours as needed for wheezing  or shortness of breath. 01/27/20 10/03/20  McDonald, Mia A, PA-C      Allergies    Rosanne Ashing allergy]    Review of Systems   See HPI for pertinent positives   Physical Exam   Vitals:   05/24/23 0916 05/24/23 0918  BP:  118/70  Pulse: 78   Resp: 19   Temp: 100.3 F (37.9 C)   SpO2: 99%     CONSTITUTIONAL:  well-appearing, NAD NEURO:  Alert and oriented x 3, CN 3-12 grossly intact EYES:  eyes equal and reactive ENT/NECK:  Supple, no stridor  CARDIO:  Regular rate and rhythm, appears well-perfused  PULM:  No respiratory distress, CTAB GI/GU:  non-distended, soft MSK/SPINE:  No gross deformities, no edema, moves all extremities  SKIN:  no rash, atraumatic  *Additional and/or pertinent findings included in MDM below   ED Results / Procedures / Treatments   Labs (all labs ordered are listed, but only abnormal results are displayed) Labs Reviewed  RESP PANEL BY RT-PCR (RSV, FLU A&B, COVID)  RVPGX2    EKG None  Radiology DG Chest 2 View  Result Date: 05/24/2023 CLINICAL DATA:  smoker, flu-like illness, r/o PNA EXAM: CHEST - 2 VIEW COMPARISON:  Jan 26, 2011 FINDINGS: The cardiomediastinal silhouette is normal in contour. No pleural effusion. No pneumothorax. No acute pleuroparenchymal abnormality. Visualized abdomen is unremarkable. No acute osseous abnormality noted. IMPRESSION: No acute cardiopulmonary abnormality. Electronically Signed   By: Meda Klinefelter M.D.   On: 05/24/2023 12:41    Procedures Procedures  Medications Ordered in ED Medications  acetaminophen (TYLENOL) tablet 1,000 mg (1,000 mg Oral Given 05/24/23 1143)    ED Course/ Medical Decision Making/ A&P                                 Medical Decision Making Amount and/or Complexity of Data Reviewed Radiology: ordered.  Risk OTC drugs.   22 year old well-appearing male presenting for fever headache cold sweats and generalized bodyaches for 4 days.  Exam is unremarkable.  Patient looks  clinically well, in no acute distress and hemodynamically stable.  Had a low-grade temp initially was treated with Tylenol.  Suspect this is a viral process given he has been around a family member recently with similar symptoms.  Advised supportive treatment at home.  Discussed PCP follow-up.  Also discussed return precautions.  Vitals remained stable throughout encounter.  Discharged home in good condition.        Final Clinical Impression(s) / ED Diagnoses Final diagnoses:  Viral illness    Rx / DC Orders ED Discharge Orders     None         Gareth Eagle, PA-C 05/24/23 1316

## 2023-05-24 NOTE — ED Triage Notes (Signed)
Patient has had cold sweats, fever, headache for 4 days. Family member was sick.

## 2023-05-24 NOTE — ED Provider Notes (Signed)
[place] EMERGENCY DEPARTMENT AT [facility] Provider Note   CSN: [id number] Arrival date & time: 05/24/23  8295     History  Chief Complaint  Patient presents with   Generalized Body Aches    Scott Wiggins is a 22 y.o. male.  HPI Patient, generally well presents with chills, congestion, body aches.  He notes that he has a nephew who is sick, but he was well himself until a few days ago.  Now over the past 2 days he has had chills, cough, congestion. No fever, no vomiting. He is a smoker.     Home Medications Prior to Admission medications   Medication Sig Start Date End Date Taking? Authorizing Provider  clotrimazole (LOTRIMIN) 1 % cream Apply to affected area 2 times daily Patient not taking: Reported on 10/03/2022 11/30/20   Rushie Chestnut, PA-C  Doxylamine Succinate, Sleep, (SLEEP AID PO) Take 1 tablet by mouth once. Patient not taking: Reported on 11/03/2022    [provider]  Fructose-Dextrose-Phosphor Acd (NAUSEA CONTROL PO) Take 1 tablet by mouth once. Patient not taking: Reported on 11/03/2022    [provider]  HYDROcodone-acetaminophen (NORCO/VICODIN) 5-325 MG tablet Take 1 tablet by mouth every 6 (six) hours as needed for severe pain. Patient not taking: Reported on 10/03/2022 10/03/20   Wallis Bamberg, PA-C  naproxen (NAPROSYN) 500 MG tablet Take 1 tablet (500 mg total) by mouth 2 (two) times daily with a meal. Patient not taking: Reported on 10/03/2022 10/03/20   Wallis Bamberg, PA-C  ondansetron (ZOFRAN) 4 MG tablet Take 1 tablet (4 mg total) by mouth every 6 (six) hours. 10/03/22   Carroll Sage, PA-C  oxyCODONE-acetaminophen (PERCOCET) 5-325 MG tablet Take 1 tablet by mouth every 4 (four) hours as needed for severe pain. 11/03/22 11/03/23  Axel Filler, MD  albuterol (VENTOLIN HFA) 108 (90 Base) MCG/ACT inhaler Inhale 2 puffs into the lungs every 4 (four) hours as needed for wheezing or shortness of breath. 01/27/20 10/03/20  McDonald,  Mia A, PA-C      Allergies    Rosanne Ashing allergy]    Review of Systems   Review of Systems  All other systems reviewed and are negative.   Physical Exam Updated Vital Signs BP 118/70   Pulse 78   Temp 100.3 F (37.9 C) (Oral)   Resp 19   Ht 5\' 9"  (1.753 m)   Wt 81.6 kg   SpO2 99%   BMI 26.58 kg/m  Physical Exam Vitals and nursing note reviewed.  Constitutional:      General: He is not in acute distress.    Appearance: He is well-developed.  HENT:     Head: Normocephalic and atraumatic.  Eyes:     Conjunctiva/sclera: Conjunctivae normal.  Cardiovascular:     Rate and Rhythm: Normal rate and regular rhythm.  Pulmonary:     Effort: Pulmonary effort is normal. No respiratory distress.     Breath sounds: No stridor.  Abdominal:     General: There is no distension.  Skin:    General: Skin is warm and dry.  Neurological:     Mental Status: He is alert and oriented to person, place, and time.     ED Results / Procedures / Treatments   Labs (all labs ordered are listed, but only abnormal results are displayed) Labs Reviewed  RESP PANEL BY RT-PCR (RSV, FLU A&B, COVID)  [id number]    EKG None  Radiology No results found.  Procedures Procedures  Medications Ordered in ED Medications  acetaminophen (TYLENOL) tablet 1,000 mg (has no administration in time range)    ED Course/ Medical Decision Making/ A&P                                 Medical Decision Making Well-appearing adult male presents with generalized complaints.  Suspicion for viral process versus pneumonia given he is a smoker. Patient has no hypoxia, tachycardia though he has mild fever here. Patient started on Tylenol, COVID sent, x-ray sent.  X-ray reviewed unremarkable, COVID test negative, though this may be early. Patient encouraged to monitor at home, follow-up with primary care.  No evidence for bacteremia, sepsis.   Amount and/or Complexity of Data Reviewed Radiology: ordered and  independent interpretation performed. Decision-making details documented in ED Course. ECG/medicine tests: ordered and independent interpretation performed. Decision-making details documented in ED Course.  Risk OTC drugs.  Final Clinical Impression(s) / ED Diagnoses Final diagnoses:  Fever in adult    Rx / DC Orders ED Discharge Orders     None         Gerhard Munch, MD 05/24/23 1317

## 2023-05-24 NOTE — Discharge Instructions (Addendum)
Today's evaluation has been reassuring.  There is no evidence for pneumonia, and your COVID test is negative.  However, as we have learned over the past few years, initial COVID tests may be negative if you are early in the course of the illness.  Please monitor your condition carefully, use Tylenol and ibuprofen for symptom control and return here for concerning changes in your condition.  Smoking cessation will be beneficial in your long-term condition.

## 2023-11-10 ENCOUNTER — Encounter (HOSPITAL_COMMUNITY): Payer: Self-pay

## 2023-11-10 ENCOUNTER — Emergency Department (HOSPITAL_COMMUNITY)
Admission: EM | Admit: 2023-11-10 | Discharge: 2023-11-11 | Disposition: A | Discharge status: Home / Self Care | Attending: Emergency Medicine | Admitting: Emergency Medicine

## 2023-11-10 ENCOUNTER — Emergency Department (HOSPITAL_COMMUNITY)

## 2023-11-10 DIAGNOSIS — D72829 Elevated white blood cell count, unspecified: Secondary | ICD-10-CM | POA: Diagnosis not present

## 2023-11-10 DIAGNOSIS — R079 Chest pain, unspecified: Secondary | ICD-10-CM | POA: Diagnosis present

## 2023-11-10 DIAGNOSIS — R0789 Other chest pain: Secondary | ICD-10-CM | POA: Insufficient documentation

## 2023-11-10 DIAGNOSIS — E876 Hypokalemia: Secondary | ICD-10-CM | POA: Insufficient documentation

## 2023-11-10 LAB — BASIC METABOLIC PANEL
Anion gap: 9 (ref 5–15)
BUN: 9 mg/dL (ref 6–20)
CO2: 24 mmol/L (ref 22–32)
Calcium: 9 mg/dL (ref 8.9–10.3)
Chloride: 106 mmol/L (ref 98–111)
Creatinine, Ser: 0.92 mg/dL (ref 0.61–1.24)
GFR, Estimated: >60 mL/min (ref >60)
Glucose, Bld: 91 mg/dL (ref 70–99)
Potassium: 3.3 mmol/L — ABNORMAL LOW (ref 3.5–5.1)
Sodium: 139 mmol/L (ref 135–145)

## 2023-11-10 LAB — CBC
HCT: 38.2 % — ABNORMAL LOW (ref 39.0–52.0)
Hemoglobin: 12.6 g/dL — ABNORMAL LOW (ref 13.0–17.0)
MCH: 27.8 pg (ref 26.0–34.0)
MCHC: 33 g/dL (ref 30.0–36.0)
MCV: 84.3 fL (ref 80.0–100.0)
Platelets: 242 10*3/uL (ref 150–400)
RBC: 4.53 MIL/uL (ref 4.22–5.81)
RDW: 13.7 % (ref 11.5–15.5)
WBC: 12.3 10*3/uL — ABNORMAL HIGH (ref 4.0–10.5)
nRBC: 0 % (ref 0.0–0.2)

## 2023-11-10 LAB — RESP PANEL BY RT-PCR (RSV, FLU A&B, COVID)  RVPGX2
Influenza A by PCR: NEGATIVE
Influenza B by PCR: NEGATIVE
Resp Syncytial Virus by PCR: NEGATIVE
SARS Coronavirus 2 by RT PCR: NEGATIVE

## 2023-11-10 LAB — TROPONIN I (HIGH SENSITIVITY): Troponin I (High Sensitivity): 8 ng/L (ref <18)

## 2023-11-10 MED ORDER — BENZONATATE 100 MG PO CAPS
100.0000 mg | ORAL_CAPSULE | Freq: Once | ORAL | Status: AC
  Administered 2023-11-10: 100 mg via ORAL
  Filled 2023-11-10: qty 1

## 2023-11-10 MED ORDER — KETOROLAC TROMETHAMINE 15 MG/ML IJ SOLN
15.0000 mg | Freq: Once | INTRAMUSCULAR | Status: AC
  Administered 2023-11-10: 15 mg via INTRAMUSCULAR
  Filled 2023-11-10: qty 1

## 2023-11-10 MED ORDER — POTASSIUM CHLORIDE CRYS ER 20 MEQ PO TBCR
40.0000 meq | EXTENDED_RELEASE_TABLET | Freq: Once | ORAL | Status: AC
  Administered 2023-11-10: 40 meq via ORAL
  Filled 2023-11-10: qty 2

## 2023-11-10 NOTE — ED Triage Notes (Signed)
 Pt states that he has been having CP that has been going on for 3 days, some SOB, radiates to back some nausea.

## 2023-11-10 NOTE — ED Provider Notes (Signed)
 Scott Wiggins ([facility]) Provider Note   CSN: [id number] Arrival date & time: 11/10/23  2228     History {Add pertinent medical, surgical, social history, OB history to HPI:1} Chief Complaint  Patient presents with  . Chest Pain    Scott Wiggins is a 23 y.o. male with no pertinent past medical history presented with 2 days of chest pain.  Patient states there are sick contacts around him with similar symptoms.  Patient states he has nonproductive cough.  Patient states that he presses on his chest he can reproduce the chest pain.  Patient has no cardiac or pulmonary history.  Patient does smoke occasionally.  Patient denies fevers.  Patient does endorse nausea without emesis.  Patient does endorse some shortness of breath.  Patient states he feels congested.  Patient is unsure of fevers.  Home Medications Prior to Admission medications   Medication Sig Start Date End Date Taking? Authorizing Provider  clotrimazole (LOTRIMIN) 1 % cream Apply to affected area 2 times daily Patient not taking: Reported on 10/03/2022 11/30/20   Scott Chestnut, PA-C  Doxylamine Succinate, Sleep, (SLEEP AID PO) Take 1 tablet by mouth once. Patient not taking: Reported on 11/03/2022    [provider]  Fructose-Dextrose-Phosphor Acd (NAUSEA CONTROL PO) Take 1 tablet by mouth once. Patient not taking: Reported on 11/03/2022    [provider]  HYDROcodone-acetaminophen (NORCO/VICODIN) 5-325 MG tablet Take 1 tablet by mouth every 6 (six) hours as needed for severe pain. Patient not taking: Reported on 10/03/2022 10/03/20   Wallis Bamberg, PA-C  naproxen (NAPROSYN) 500 MG tablet Take 1 tablet (500 mg total) by mouth 2 (two) times daily with a meal. Patient not taking: Reported on 10/03/2022 10/03/20   Wallis Bamberg, PA-C  ondansetron (ZOFRAN) 4 MG tablet Take 1 tablet (4 mg total) by mouth every 6 (six) hours. Patient not taking: Reported on 11/10/2023 10/03/22   Carroll Sage, PA-C  albuterol (VENTOLIN HFA) 108 (90 Base) MCG/ACT inhaler Inhale 2 puffs into the lungs every 4 (four) hours as needed for wheezing or shortness of breath. 01/27/20 10/03/20  McDonald, Mia A, PA-C      Allergies    Rosanne Ashing allergy]    Review of Systems   Review of Systems  Cardiovascular:  Positive for chest pain.    Physical Exam Updated Vital Signs BP 112/72   Pulse 73   Temp 99 F (37.2 C) (Oral)   Resp 17   Ht 5\' 9"  (1.753 m)   Wt 83.9 kg   SpO2 100%   BMI 27.32 kg/m  Physical Exam Vitals reviewed.  Constitutional:      General: He is not in acute distress. HENT:     Head: Normocephalic and atraumatic.  Eyes:     Extraocular Movements: Extraocular movements intact.     Conjunctiva/sclera: Conjunctivae normal.     Pupils: Pupils are equal, round, and reactive to light.  Cardiovascular:     Rate and Rhythm: Normal rate and regular rhythm.     Pulses: Normal pulses.     Heart sounds: Normal heart sounds.     Comments: 2+ bilateral radial/dorsalis pedis pulses with regular rate Pulmonary:     Effort: Pulmonary effort is normal. No respiratory distress.     Breath sounds: Normal breath sounds.  Chest:     Chest wall: Tenderness (Able to reproduce patient's chest pain palpation of chest wall without bony abnormalities present) present.  Abdominal:  Palpations: Abdomen is soft.     Tenderness: There is no abdominal tenderness. There is no guarding or rebound.  Musculoskeletal:        General: Normal range of motion.     Cervical back: Normal range of motion and neck supple.     Comments: 5 out of 5 bilateral grip/leg extension strength  Skin:    General: Skin is warm and dry.     Capillary Refill: Capillary refill takes less than 2 seconds.  Neurological:     General: No focal deficit present.     Mental Status: He is alert and oriented to person, place, and time.     Comments: Sensation intact in all 4 limbs  Psychiatric:        Mood and  Affect: Mood normal.    ED Results / Procedures / Treatments   Labs (all labs ordered are listed, but only abnormal results are displayed) Labs Reviewed  BASIC METABOLIC PANEL - Abnormal; Notable for the following components:      Result Value   Potassium 3.3 (*)    All other components within normal limits  CBC - Abnormal; Notable for the following components:   WBC 12.3 (*)    Hemoglobin 12.6 (*)    HCT 38.2 (*)    All other components within normal limits  RESP PANEL BY RT-PCR (RSV, FLU A&B, COVID)  RVPGX2  TROPONIN I (HIGH SENSITIVITY)    EKG None  Radiology DG Chest 2 View Result Date: 11/10/2023 CLINICAL DATA:  Chest pain EXAM: CHEST - 2 VIEW COMPARISON:  Chest radiograph 05/24/2023 FINDINGS: The heart size and mediastinal contours are within normal limits. Both lungs are clear. The visualized skeletal structures are unremarkable. IMPRESSION: No active cardiopulmonary disease. Electronically Signed   By: Annia Belt M.D.   On: 11/10/2023 23:22    Procedures Procedures  {Document cardiac monitor, telemetry assessment procedure when appropriate:1}  Medications Ordered in ED Medications  benzonatate (TESSALON) capsule 100 mg (has no administration in time range)  ketorolac (TORADOL) 15 MG/ML injection 15 mg (has no administration in time range)  potassium chloride SA (KLOR-CON M) CR tablet 40 mEq (has no administration in time range)    ED Course/ Medical Decision Making/ A&P   {   Click here for ABCD2, HEART and other calculatorsREFRESH Note before signing :1}                              Medical Decision Making Amount and/or Complexity of Data Reviewed Labs: ordered. Radiology: ordered.  Risk Prescription drug management.   Scott Wiggins 23 y.o. presented today for chest pain. Working DDx that I considered at this time includes, but not limited to, ACS, pulmonary embolism, community-acquired pneumonia, aortic dissection, pneumothorax, underlying bony  abnormality, anemia, thyrotoxicosis, HTN urgency/emergency, esophageal rupture, CHF exacerbation, valvular disorder, myocarditis, pericarditis, endocarditis, pericardial effusion/cardiac tamponade, pulmonary edema, gastritis/PUD/GERD, esophagitis, MSK.  R/o Dx: ***: These are considered less likely due to history of present illness and physical exam findings. *** PE: CTA negative ***D-dimer negative ***Well's Score is *** and so advanced imaging will not be ordered as alternative diagnoses are more likely at this time Aortic Dissection: less likely based on the location, quality, onset, and severity of symptoms in this case. Patient also has a lack of underlying history of AD or TAA.   Review of prior external notes: ***  Unique Tests and My Interpretation:  EKG: Rate, rhythm, axis,  intervals all examined and without medically relevant abnormality. ST segments without concerns for elevations Troponin: *** CXR: *** CBC: *** BMP: ***  Social Determinants of Health: {social:31487}  Discussion with Independent Historian: {historian:29369}  Discussion of Management of Tests: {historian:29369}  Risk: {Risk:29370}  Risk Stratification Score: HEART:  Staffed with ***  Plan: On exam patient was in no acute distress with stable vitals. Patient's physical was remarkable for reproducing patient's chest pain with palpation without bony abnormalities. Labs and CXR will be ordered.  The cardiac monitor was ordered secondary to the patient's history of chest pain and to monitor the patient for dysrhythmia. The cardiac monitor revealed normal sinus as interpreted by me.  Patient does endorse sick contacts and is endorsing a nonproductive cough along with feeling congested suspicious of viral illness.  Will give patient Tessalon to help with this cough.  Bedside echo does not show any acute abnormalities.  Will give patient Toradol to help with his discomfort.  Patient's labs do show mild hypokalemia  3.3 and so we will replenish this as well.  Patient said chest pain for the past 2 days that has been constant when he coughs.  First troponin is negative and so do not feel we need a second 1 as patient is a young healthy male and troponin should have been elevated at this point given duration.  Do feel patient's chest pain is MSK in nature as I am able to reproduce it and patient states that he has been coughing a lot which I suspect is the cause.  Patient stable at this time.  Patient's labs and imaging are reassuring.  Will p.o. challenge and if successful will discharge to primary care follow-up.  Patient was given return precautions. Patient stable for discharge at this time.  Patient verbalized understanding of plan.  Discussed smoking cessation with patient and they were offered resources to help stop.  Total time was 5 min CPT code 40981.   This chart was dictated using voice recognition software.  Despite best efforts to proofread,  errors can occur which can change the documentation meaning.   {Document critical care time when appropriate:1} {Document review of labs and clinical decision tools ie heart score, Chads2Vasc2 etc:1}  {Document your independent review of radiology images, and any outside records:1} {Document your discussion with family members, caretakers, and with consultants:1} {Document social determinants of health affecting pt's care:1} {Document your decision making why or why not admission, treatments were needed:1} Final Clinical Impression(s) / ED Diagnoses Final diagnoses:  None    Rx / DC Orders ED Discharge Orders     None

## 2023-11-11 MED ORDER — BENZONATATE 100 MG PO CAPS: 100.0000 mg | ORAL_CAPSULE | Freq: Three times a day (TID) | ORAL | 0 refills | Status: DC

## 2023-11-11 NOTE — Discharge Instructions (Addendum)
 Please follow-up with your primary care provider in regards to recent symptoms and ER visit.  You have been seen in the Emergency Department (ED) today for chest pain.  As we have discussed today's test results are normal, and we believe your pain is due to pain/strain and/or inflammation of the muscles and/or cartilage of your chest wall.  We recommend you take ibuprofen 600 mg three times a day with meals for the next 5 days (unless you have been told previously not to take ibuprofen or NSAIDs in general).  You may also take Tylenol every 6 hours as needed for pain according to the label instructions.  Read through the included information for additional treatment recommendations and precautions.  Continue to take your regular medications.   Return to the Emergency Department (ED) if you experience any further chest pain/pressure/tightness, difficulty breathing, or sudden sweating, or other symptoms that concern you.

## 2023-11-11 NOTE — ED Notes (Signed)
 Patient took medication and fluids well. Patient was talking on phone when I entered the room.

## 2024-03-21 ENCOUNTER — Encounter (HOSPITAL_COMMUNITY): Payer: Self-pay | Admitting: Emergency Medicine

## 2024-03-21 ENCOUNTER — Emergency Department (HOSPITAL_COMMUNITY)
Admission: EM | Admit: 2024-03-21 | Discharge: 2024-03-22 | Discharge status: Against Medical Advice | Attending: Emergency Medicine | Admitting: Emergency Medicine

## 2024-03-21 DIAGNOSIS — R109 Unspecified abdominal pain: Secondary | ICD-10-CM | POA: Diagnosis not present

## 2024-03-21 DIAGNOSIS — R1110 Vomiting, unspecified: Secondary | ICD-10-CM | POA: Diagnosis not present

## 2024-03-21 DIAGNOSIS — Z5321 Procedure and treatment not carried out due to patient leaving prior to being seen by health care provider: Secondary | ICD-10-CM | POA: Insufficient documentation

## 2024-03-21 DIAGNOSIS — R519 Headache, unspecified: Secondary | ICD-10-CM | POA: Insufficient documentation

## 2024-03-21 LAB — COMPREHENSIVE METABOLIC PANEL WITH GFR
ALT: 13 U/L (ref 0–44)
AST: 25 U/L (ref 15–41)
Albumin: 4.6 g/dL (ref 3.5–5.0)
Alkaline Phosphatase: 76 U/L (ref 38–126)
Anion gap: 16 — ABNORMAL HIGH (ref 5–15)
BUN: 12 mg/dL (ref 6–20)
CO2: 23 mmol/L (ref 22–32)
Calcium: 9.8 mg/dL (ref 8.9–10.3)
Chloride: 102 mmol/L (ref 98–111)
Creatinine, Ser: 1.05 mg/dL (ref 0.61–1.24)
GFR, Estimated: >60 mL/min (ref >60)
Glucose, Bld: 138 mg/dL — ABNORMAL HIGH (ref 70–99)
Potassium: 3.2 mmol/L — ABNORMAL LOW (ref 3.5–5.1)
Sodium: 141 mmol/L (ref 135–145)
Total Bilirubin: 0.7 mg/dL (ref 0.0–1.2)
Total Protein: 8.9 g/dL — ABNORMAL HIGH (ref 6.5–8.1)

## 2024-03-21 LAB — CBC
HCT: 43.9 % (ref 39.0–52.0)
Hemoglobin: 14.3 g/dL (ref 13.0–17.0)
MCH: 27.7 pg (ref 26.0–34.0)
MCHC: 32.6 g/dL (ref 30.0–36.0)
MCV: 85.1 fL (ref 80.0–100.0)
Platelets: 276 K/uL (ref 150–400)
RBC: 5.16 MIL/uL (ref 4.22–5.81)
RDW: 13.5 % (ref 11.5–15.5)
WBC: 21.9 K/uL — ABNORMAL HIGH (ref 4.0–10.5)
nRBC: 0 % (ref 0.0–0.2)

## 2024-03-21 LAB — LIPASE, BLOOD: Lipase: 32 U/L (ref 11–51)

## 2024-03-21 NOTE — ED Notes (Signed)
 Patient c/o feeling nauseous at this time.

## 2024-03-21 NOTE — ED Triage Notes (Signed)
 Pt arriving via [facility] from home for headache and emesis that began today. Has taken 2 Aleve  PM. Mild abdominal pain reported. Denies taking any medications regularly. Pt states he has had an increase in life stressors which may be cause of headaches.

## 2024-03-22 NOTE — ED Notes (Signed)
 Pt requested nausea medication while In the lobby, Rn notified

## 2024-06-16 ENCOUNTER — Ambulatory Visit (HOSPITAL_COMMUNITY)
Admission: EM | Admit: 2024-06-16 | Discharge: 2024-06-16 | Disposition: A | Discharge status: Home / Self Care | Attending: Physician Assistant | Admitting: Physician Assistant

## 2024-06-16 ENCOUNTER — Encounter (HOSPITAL_COMMUNITY): Payer: Self-pay

## 2024-06-16 DIAGNOSIS — L089 Local infection of the skin and subcutaneous tissue, unspecified: Secondary | ICD-10-CM

## 2024-06-16 DIAGNOSIS — L0292 Furuncle, unspecified: Secondary | ICD-10-CM

## 2024-06-16 DIAGNOSIS — L732 Hidradenitis suppurativa: Secondary | ICD-10-CM | POA: Diagnosis not present

## 2024-06-16 MED ORDER — DOXYCYCLINE HYCLATE 100 MG PO CAPS: 100.0000 mg | ORAL_CAPSULE | Freq: Two times a day (BID) | ORAL | 0 refills | Status: DC

## 2024-06-16 NOTE — ED Triage Notes (Signed)
 Patient reports that he has a skin irritation to the posterior upper thigh that he has had before. Patient then states he has several boil-like things, but only one is bothering him.   Patient states he is out of the medication that he usually puts on them.  Patient doesn't know the name of the medicine  he uses.

## 2024-06-16 NOTE — Discharge Instructions (Signed)
 We did not try to drain the lesion today since it is already draining.  Continue using warm compresses and keep the area clean.  Use doxycycline  100 mg twice daily for 2 weeks.  Stay out of the sun while on this medicine.  You can use Tylenol  and ibuprofen  for pain.  I do think it is a good idea to follow-up with a dermatologist.  Call to schedule an appointment.  If anything worsens and you have enlarging lesion, fever, increasing pain, nausea/vomiting you need to be seen immediately.

## 2024-06-16 NOTE — ED Provider Notes (Signed)
 [facility]    CSN: [id number] Arrival date & time: 06/16/24  1032      History   Chief Complaint Chief Complaint  Patient presents with   Abscess   Medication Refill    HPI Scott Wiggins is a 23 y.o. male.   Patient presents today with a weeklong history of recurrent lesions on his left posterior thigh and buttocks.  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  He previously had an old prescription of doxycycline  but ran out of this recently and so does not have any medication to treat his symptoms.  His symptoms began after he started a new job on 06/01/2024 that required him to do lots of standing and walking overnight.  He reports that the lesion began about a week later and then has gradually been enlarging.  He was unable to attend work because of the pain and so he took a warm shower which allowed for drainage.  He has not had any fever, nausea, vomiting.  He reports that the pain has improved since the lesion drained and is currently rated 3 on a 0-10 pain scale, localized to his posterior left upper leg, described as throbbing, no alleviating factors identified.  He denies any recent antibiotic use.  He is requesting a work excuse note.    Past Medical History:  Diagnosis Date   Seasonal allergies     There are no active problems to display for this patient.   Past Surgical History:  Procedure Laterality Date   APPENDECTOMY     LAPAROSCOPIC APPENDECTOMY N/A 11/03/2022   Procedure: APPENDECTOMY LAPAROSCOPIC;  Surgeon: Rubin Calamity, MD;  Location: [facility];  Service: General;  Laterality: N/A;   PILONIDAL CYST EXCISION N/A 05/21/2020   Procedure: EXCISION PILONIDAL CYST;  Surgeon: Vanderbilt Ned, MD;  Location: [facility];  Service: General;  Laterality: N/A;       Home Medications    Prior to Admission medications   Medication Sig Start Date End Date Taking?  Authorizing Provider  doxycycline  (VIBRAMYCIN ) 100 MG capsule Take 1 capsule (100 mg total) by mouth 2 (two) times daily. 06/16/24  Yes Linh Hedberg, Rocky POUR, PA-C  albuterol  (VENTOLIN  HFA) 108 (90 Base) MCG/ACT inhaler Inhale 2 puffs into the lungs every 4 (four) hours as needed for wheezing or shortness of breath. 01/27/20 10/03/20  McDonald, Logan LABOR, PA-C    Family History History reviewed. No pertinent family history.  Social History Social History   Tobacco Use   Smoking status: Some Days    Types: Cigars   Smokeless tobacco: Never  Vaping Use   Vaping status: Never Used  Substance Use Topics   Alcohol use: Never   Drug use: Yes    Types: Marijuana     Allergies   Salmon [fish allergy]   Review of Systems Review of Systems  Constitutional:  Positive for activity change. Negative for appetite change, fatigue and fever.  Gastrointestinal:  Negative for abdominal pain, diarrhea, nausea and vomiting.  Musculoskeletal:  Negative for arthralgias and myalgias.  Skin:  Positive for color change and wound.     Physical Exam Triage Vital Signs ED Triage Vitals  Encounter Vitals Group     BP 06/16/24 1132 112/71     Girls Systolic BP Percentile --      Girls Diastolic BP Percentile --      Boys Systolic BP Percentile --  Boys Diastolic BP Percentile --      Pulse Rate 06/16/24 1132 76     Resp 06/16/24 1132 14     Temp 06/16/24 1132 98.1 F (36.7 C)     Temp Source 06/16/24 1132 Oral     SpO2 06/16/24 1132 96 %     Weight --      Height --      Head Circumference --      Peak Flow --      Pain Score 06/16/24 1131 3     Pain Loc --      Pain Education --      Exclude from Growth Chart --    No data found.  Updated Vital Signs BP 112/71 (BP Location: Left Arm)   Pulse 76   Temp 98.1 F (36.7 C) (Oral)   Resp 14   SpO2 96%   Visual Acuity Right Eye Distance:   Left Eye Distance:   Bilateral Distance:    Right Eye Near:   Left Eye Near:    Bilateral Near:      Physical Exam Vitals reviewed.  Constitutional:      General: He is awake.     Appearance: Normal appearance. He is well-developed. He is not ill-appearing.     Comments: Very pleasant male appears stated age in no acute distress sitting comfortably in exam room  HENT:     Head: Normocephalic and atraumatic.  Cardiovascular:     Rate and Rhythm: Normal rate and regular rhythm.     Heart sounds: Normal heart sounds, S1 normal and S2 normal. No murmur heard. Pulmonary:     Effort: Pulmonary effort is normal.     Breath sounds: Normal breath sounds. No stridor. No wheezing, rhonchi or rales.     Comments: Clear to auscultation bilaterally Skin:    Findings: Abscess and rash present. Rash is pustular.         Comments: Multiple pustules noted posterior left leg with 1 large lesion at base of left gluteus measuring approximately 3 cm x 2 cm with active drainage and central induration without fluctuance.  No streaking or evidence of lymphangitis.  Neurological:     Mental Status: He is alert.  Psychiatric:        Behavior: Behavior is cooperative.      UC Treatments / Results  Labs (all labs ordered are listed, but only abnormal results are displayed) Labs Reviewed - No data to display  EKG   Radiology No results found.  Procedures Procedures (including critical care time)  Medications Ordered in UC Medications - No data to display  Initial Impression / Assessment and Plan / UC Course  I have reviewed the triage vital signs and the nursing notes.  Pertinent labs & imaging results that were available during my care of the patient were reviewed by me and considered in my medical decision making (see chart for details).     Patient is well-appearing, afebrile, nontoxic, nontachycardic.  I&D was not attempted as lesion is actively draining and there is no significant fluctuance.  He was encouraged to continue with warm compresses and keep the area clean.  Will start  doxycycline  100 mg for 2 weeks.  We discussed that he should avoid prolonged exposure while on this medication due to associated photosensitivity.  He is not currently following with a dermatologist and I did recommend that he follow-up with specialist for further evaluation and management; he was given the contact information for  local provider with instruction to call to schedule appointment.  He can use over-the-counter  analgesics for pain relief.  We discussed that if this lesion enlarges, he develops additional drainage, fever, increasing pain, nausea/vomiting he needs to be seen emergently.  Strict return precautions given.  Excuse note provided.  Final Clinical Impressions(s) / UC Diagnoses   Final diagnoses:  Hidradenitis suppurativa  Recurrent boils  Skin infection     Discharge Instructions      We did not try to drain the lesion today since it is already draining.  Continue using warm compresses and keep the area clean.  Use doxycycline  100 mg twice daily for 2 weeks.  Stay out of the sun while on this medicine.  You can use Tylenol  and ibuprofen  for pain.  I do think it is a good idea to follow-up with a dermatologist.  Call to schedule an appointment.  If anything worsens and you have enlarging lesion, fever, increasing pain, nausea/vomiting you need to be seen immediately.     ED Prescriptions     Medication Sig Dispense Auth. Provider   doxycycline  (VIBRAMYCIN ) 100 MG capsule Take 1 capsule (100 mg total) by mouth 2 (two) times daily. 28 capsule Braylynn Ghan K, PA-C      PDMP not reviewed this encounter.   Sherrell Rocky POUR, PA-C 06/16/24 1200

## 2024-07-15 ENCOUNTER — Ambulatory Visit (HOSPITAL_COMMUNITY)
Admission: EM | Admit: 2024-07-15 | Discharge: 2024-07-15 | Disposition: A | Discharge status: Home / Self Care | Attending: Physician Assistant | Admitting: Physician Assistant

## 2024-07-15 ENCOUNTER — Encounter (HOSPITAL_COMMUNITY): Payer: Self-pay

## 2024-07-15 DIAGNOSIS — K0889 Other specified disorders of teeth and supporting structures: Secondary | ICD-10-CM | POA: Diagnosis not present

## 2024-07-15 DIAGNOSIS — K047 Periapical abscess without sinus: Secondary | ICD-10-CM | POA: Diagnosis not present

## 2024-07-15 MED ORDER — AMOXICILLIN 500 MG PO CAPS: 500.0000 mg | ORAL_CAPSULE | Freq: Two times a day (BID) | ORAL | 0 refills | Status: AC | PRN

## 2024-07-15 NOTE — Discharge Instructions (Addendum)
 Start amoxicillin twice daily for 7 days.  Use Tylenol  ibuprofen  for pain.  Gargle with warm salt water for additional symptom relief.  You should follow-up with dentist; call to schedule an appointment.  If you develop any worsening symptoms including difficulty swallowing, difficulty speaking, swelling of your throat, high fever, change in your voice you need to be seen immediately.

## 2024-07-15 NOTE — ED Triage Notes (Signed)
 Presenting with tooth aches onset last week. There was upper swelling that has since resolved. Denies any oral trauma. States there is a tooth on the upper right side that needs to be removed.   Patient tried tylenol  with little relief.

## 2024-07-15 NOTE — ED Provider Notes (Signed)
 [facility]    CSN: [id number] Arrival date & time: 07/15/24  1102      History   Chief Complaint Chief Complaint  Patient presents with   Dental Pain    HPI Scott Wiggins is a 23 y.o. male.   Patient presents today with 1 day history of left lower jaw pain.  He reports that he has a known broken tooth in this area but that the break happened a while ago.  He denies any recent dental trauma or recent dental procedures.  He reports the pain has improved some today but continues to be uncomfortable.  Pain is rated 4 on a 0-10 pain scale, described as throbbing, no aggravating or alleviating factors identified.  He has not seen a dentist recently does not have someone that he follows with regularly.  He was treated with doxycycline  about a month ago for a hidradenitis suppurativa but denies additional antibiotics in the past 90 days.  He has tried Tylenol  without improvement of symptoms.  He denies any swelling of his throat, shortness of breath, muffled voice, nausea, vomiting, fever.  He did miss work as a result of symptoms and is requesting a work excuse note.    Past Medical History:  Diagnosis Date   Seasonal allergies     There are no active problems to display for this patient.   Past Surgical History:  Procedure Laterality Date   APPENDECTOMY     LAPAROSCOPIC APPENDECTOMY N/A 11/03/2022   Procedure: APPENDECTOMY LAPAROSCOPIC;  Surgeon: Rubin Calamity, MD;  Location: [facility];  Service: General;  Laterality: N/A;   PILONIDAL CYST EXCISION N/A 05/21/2020   Procedure: EXCISION PILONIDAL CYST;  Surgeon: Vanderbilt Ned, MD;  Location: [facility];  Service: General;  Laterality: N/A;       Home Medications    Prior to Admission medications   Medication Sig Start Date End Date Taking? Authorizing Provider  amoxicillin (AMOXIL) 500 MG capsule Take 1 capsule (500 mg total) by mouth 2 (two) times daily as needed for up to 7 days.  07/15/24 07/22/24 Yes Fedra Lanter K, PA-C  albuterol  (VENTOLIN  HFA) 108 (90 Base) MCG/ACT inhaler Inhale 2 puffs into the lungs every 4 (four) hours as needed for wheezing or shortness of breath. 01/27/20 10/03/20  McDonald, Logan LABOR, PA-C    Family History History reviewed. No pertinent family history.  Social History Social History   Tobacco Use   Smoking status: Some Days    Types: Cigars   Smokeless tobacco: Never  Vaping Use   Vaping status: Never Used  Substance Use Topics   Alcohol use: Never   Drug use: Yes    Types: Marijuana     Allergies   Salmon [fish allergy]   Review of Systems Review of Systems  Constitutional:  Positive for activity change. Negative for appetite change, fatigue and fever.  HENT:  Positive for dental problem. Negative for congestion, sore throat, trouble swallowing and voice change.   Respiratory:  Negative for shortness of breath.   Gastrointestinal:  Negative for diarrhea, nausea and vomiting.  Neurological:  Negative for dizziness, light-headedness and headaches.     Physical Exam Triage Vital Signs ED Triage Vitals  Encounter Vitals Group     BP 07/15/24 1224 116/74     Girls Systolic BP Percentile --      Girls Diastolic BP Percentile --      Boys Systolic BP Percentile --      Boys Diastolic BP Percentile --  Pulse Rate 07/15/24 1224 60     Resp 07/15/24 1224 16     Temp 07/15/24 1224 98.2 F (36.8 C)     Temp Source 07/15/24 1224 Oral     SpO2 07/15/24 1224 95 %     Weight --      Height --      Head Circumference --      Peak Flow --      Pain Score 07/15/24 1223 4     Pain Loc --      Pain Education --      Exclude from Growth Chart --    No data found.  Updated Vital Signs BP 116/74 (BP Location: Left Arm)   Pulse 60   Temp 98.2 F (36.8 C) (Oral)   Resp 16   SpO2 95%   Visual Acuity Right Eye Distance:   Left Eye Distance:   Bilateral Distance:    Right Eye Near:   Left Eye Near:    Bilateral  Near:     Physical Exam Vitals reviewed.  Constitutional:      General: He is awake.     Appearance: Normal appearance. He is well-developed. He is not ill-appearing.     Comments: Very pleasant male appears stated age in no acute distress sitting comfortably in exam room  HENT:     Head: Normocephalic and atraumatic.     Mouth/Throat:     Dentition: Abnormal dentition. Gingival swelling present. No dental abscesses.     Pharynx: Uvula midline. No oropharyngeal exudate or posterior oropharyngeal erythema.      Comments: Tooth #19 is broken at the level of the gum with associated gingival swelling.  No abscess noted on exam.  No evidence of Ludwig angina.  Normal-appearing posterior oropharynx. Cardiovascular:     Rate and Rhythm: Normal rate and regular rhythm.     Heart sounds: Normal heart sounds, S1 normal and S2 normal. No murmur heard. Pulmonary:     Effort: Pulmonary effort is normal.     Breath sounds: Normal breath sounds. No stridor. No wheezing, rhonchi or rales.     Comments: Clear to auscultation bilaterally Neurological:     Mental Status: He is alert.  Psychiatric:        Behavior: Behavior is cooperative.      UC Treatments / Results  Labs (all labs ordered are listed, but only abnormal results are displayed) Labs Reviewed - No data to display  EKG   Radiology No results found.  Procedures Procedures (including critical care time)  Medications Ordered in UC Medications - No data to display  Initial Impression / Assessment and Plan / UC Course  I have reviewed the triage vital signs and the nursing notes.  Pertinent labs & imaging results that were available during my care of the patient were reviewed by me and considered in my medical decision making (see chart for details).     Patient is well-appearing, afebrile, nontoxic, nontachycardic.  No indication for emergent evaluation or imaging.  Patient was treated for dental infection with amoxicillin  twice daily for 7 days.  You can use Tylenol  and ibuprofen  over-the-counter for pain relief.  Recommend he gargle with warm salt water for additional symptom relief.  Discussed that ultimately he will need to see dentist to address underlying tooth.  He was provided low-cost dental resources in the area with after visit summary.  If he develops any worsening symptoms including fever, nausea, vomiting, swelling of his throat,  muffled voice, dysphagia he needs to go to the emergency room to which he expressed understanding.  Work excuse note provided.   Final Clinical Impressions(s) / UC Diagnoses   Final diagnoses:  Dental infection  Dentalgia     Discharge Instructions      Start amoxicillin twice daily for 7 days.  Use Tylenol  ibuprofen  for pain.  Gargle with warm salt water for additional symptom relief.  You should follow-up with dentist; call to schedule an appointment.  If you develop any worsening symptoms including difficulty swallowing, difficulty speaking, swelling of your throat, high fever, change in your voice you need to be seen immediately.      ED Prescriptions     Medication Sig Dispense Auth. Provider   amoxicillin (AMOXIL) 500 MG capsule Take 1 capsule (500 mg total) by mouth 2 (two) times daily as needed for up to 7 days. 14 capsule Yudith Norlander K, PA-C      PDMP not reviewed this encounter.   Sherrell Rocky POUR, PA-C 07/15/24 1329
